# Patient Record
Sex: Female | Born: 1945
Health system: Southern US, Community
[De-identification: ages and names within clinical notes are randomized; demographics above are authoritative.]

---

## 2014-07-17 DIAGNOSIS — N183 Chronic kidney disease, stage 3 (moderate): Secondary | ICD-10-CM | POA: Diagnosis not present

## 2014-07-17 DIAGNOSIS — K219 Gastro-esophageal reflux disease without esophagitis: Secondary | ICD-10-CM | POA: Diagnosis not present

## 2014-07-17 DIAGNOSIS — Z6831 Body mass index (BMI) 31.0-31.9, adult: Secondary | ICD-10-CM | POA: Diagnosis not present

## 2014-07-17 DIAGNOSIS — E1165 Type 2 diabetes mellitus with hyperglycemia: Secondary | ICD-10-CM | POA: Diagnosis not present

## 2014-07-17 DIAGNOSIS — D649 Anemia, unspecified: Secondary | ICD-10-CM | POA: Diagnosis not present

## 2014-07-17 DIAGNOSIS — E780 Pure hypercholesterolemia: Secondary | ICD-10-CM | POA: Diagnosis not present

## 2014-07-17 DIAGNOSIS — I10 Essential (primary) hypertension: Secondary | ICD-10-CM | POA: Diagnosis not present

## 2014-07-17 DIAGNOSIS — M545 Low back pain: Secondary | ICD-10-CM | POA: Diagnosis not present

## 2014-08-10 DIAGNOSIS — E119 Type 2 diabetes mellitus without complications: Secondary | ICD-10-CM | POA: Diagnosis not present

## 2014-08-10 DIAGNOSIS — H26499 Other secondary cataract, unspecified eye: Secondary | ICD-10-CM | POA: Diagnosis not present

## 2014-08-10 DIAGNOSIS — I10 Essential (primary) hypertension: Secondary | ICD-10-CM | POA: Diagnosis not present

## 2014-08-10 DIAGNOSIS — H5210 Myopia, unspecified eye: Secondary | ICD-10-CM | POA: Diagnosis not present

## 2014-08-10 DIAGNOSIS — H524 Presbyopia: Secondary | ICD-10-CM | POA: Diagnosis not present

## 2014-08-11 DIAGNOSIS — M545 Low back pain: Secondary | ICD-10-CM | POA: Diagnosis not present

## 2014-08-11 DIAGNOSIS — K219 Gastro-esophageal reflux disease without esophagitis: Secondary | ICD-10-CM | POA: Diagnosis not present

## 2014-08-11 DIAGNOSIS — N183 Chronic kidney disease, stage 3 (moderate): Secondary | ICD-10-CM | POA: Diagnosis not present

## 2014-08-11 DIAGNOSIS — I10 Essential (primary) hypertension: Secondary | ICD-10-CM | POA: Diagnosis not present

## 2014-08-11 DIAGNOSIS — J208 Acute bronchitis due to other specified organisms: Secondary | ICD-10-CM | POA: Diagnosis not present

## 2014-08-11 DIAGNOSIS — E1165 Type 2 diabetes mellitus with hyperglycemia: Secondary | ICD-10-CM | POA: Diagnosis not present

## 2014-08-11 DIAGNOSIS — E669 Obesity, unspecified: Secondary | ICD-10-CM | POA: Diagnosis not present

## 2014-08-11 DIAGNOSIS — E780 Pure hypercholesterolemia: Secondary | ICD-10-CM | POA: Diagnosis not present

## 2014-09-28 ENCOUNTER — Ambulatory Visit: Payer: Self-pay

## 2014-10-02 DIAGNOSIS — E780 Pure hypercholesterolemia: Secondary | ICD-10-CM | POA: Diagnosis not present

## 2014-10-02 DIAGNOSIS — M545 Low back pain: Secondary | ICD-10-CM | POA: Diagnosis not present

## 2014-10-02 DIAGNOSIS — K219 Gastro-esophageal reflux disease without esophagitis: Secondary | ICD-10-CM | POA: Diagnosis not present

## 2014-10-02 DIAGNOSIS — E1165 Type 2 diabetes mellitus with hyperglycemia: Secondary | ICD-10-CM | POA: Diagnosis not present

## 2014-10-02 DIAGNOSIS — N183 Chronic kidney disease, stage 3 (moderate): Secondary | ICD-10-CM | POA: Diagnosis not present

## 2014-10-02 DIAGNOSIS — Z6832 Body mass index (BMI) 32.0-32.9, adult: Secondary | ICD-10-CM | POA: Diagnosis not present

## 2014-10-02 DIAGNOSIS — I10 Essential (primary) hypertension: Secondary | ICD-10-CM | POA: Diagnosis not present

## 2014-12-11 DIAGNOSIS — H269 Unspecified cataract: Secondary | ICD-10-CM | POA: Diagnosis not present

## 2014-12-11 DIAGNOSIS — I10 Essential (primary) hypertension: Secondary | ICD-10-CM | POA: Diagnosis not present

## 2014-12-11 DIAGNOSIS — Z1389 Encounter for screening for other disorder: Secondary | ICD-10-CM | POA: Diagnosis not present

## 2014-12-11 DIAGNOSIS — E1165 Type 2 diabetes mellitus with hyperglycemia: Secondary | ICD-10-CM | POA: Diagnosis not present

## 2014-12-11 DIAGNOSIS — E780 Pure hypercholesterolemia: Secondary | ICD-10-CM | POA: Diagnosis not present

## 2014-12-11 DIAGNOSIS — K219 Gastro-esophageal reflux disease without esophagitis: Secondary | ICD-10-CM | POA: Diagnosis not present

## 2014-12-11 DIAGNOSIS — Z9181 History of falling: Secondary | ICD-10-CM | POA: Diagnosis not present

## 2014-12-11 DIAGNOSIS — D649 Anemia, unspecified: Secondary | ICD-10-CM | POA: Diagnosis not present

## 2014-12-25 DIAGNOSIS — Z139 Encounter for screening, unspecified: Secondary | ICD-10-CM | POA: Diagnosis not present

## 2014-12-25 DIAGNOSIS — I10 Essential (primary) hypertension: Secondary | ICD-10-CM | POA: Diagnosis not present

## 2014-12-25 DIAGNOSIS — K219 Gastro-esophageal reflux disease without esophagitis: Secondary | ICD-10-CM | POA: Diagnosis not present

## 2014-12-25 DIAGNOSIS — Z6832 Body mass index (BMI) 32.0-32.9, adult: Secondary | ICD-10-CM | POA: Diagnosis not present

## 2014-12-25 DIAGNOSIS — E1165 Type 2 diabetes mellitus with hyperglycemia: Secondary | ICD-10-CM | POA: Diagnosis not present

## 2014-12-25 DIAGNOSIS — N183 Chronic kidney disease, stage 3 (moderate): Secondary | ICD-10-CM | POA: Diagnosis not present

## 2014-12-25 DIAGNOSIS — E780 Pure hypercholesterolemia: Secondary | ICD-10-CM | POA: Diagnosis not present

## 2014-12-25 DIAGNOSIS — M545 Low back pain: Secondary | ICD-10-CM | POA: Diagnosis not present

## 2015-01-08 DIAGNOSIS — E1165 Type 2 diabetes mellitus with hyperglycemia: Secondary | ICD-10-CM | POA: Diagnosis not present

## 2015-01-08 DIAGNOSIS — M545 Low back pain: Secondary | ICD-10-CM | POA: Diagnosis not present

## 2015-01-08 DIAGNOSIS — E780 Pure hypercholesterolemia: Secondary | ICD-10-CM | POA: Diagnosis not present

## 2015-01-08 DIAGNOSIS — Z6831 Body mass index (BMI) 31.0-31.9, adult: Secondary | ICD-10-CM | POA: Diagnosis not present

## 2015-01-08 DIAGNOSIS — K219 Gastro-esophageal reflux disease without esophagitis: Secondary | ICD-10-CM | POA: Diagnosis not present

## 2015-01-08 DIAGNOSIS — N183 Chronic kidney disease, stage 3 (moderate): Secondary | ICD-10-CM | POA: Diagnosis not present

## 2015-01-08 DIAGNOSIS — I10 Essential (primary) hypertension: Secondary | ICD-10-CM | POA: Diagnosis not present

## 2015-01-18 DIAGNOSIS — K219 Gastro-esophageal reflux disease without esophagitis: Secondary | ICD-10-CM | POA: Diagnosis not present

## 2015-01-18 DIAGNOSIS — E1165 Type 2 diabetes mellitus with hyperglycemia: Secondary | ICD-10-CM | POA: Diagnosis not present

## 2015-01-18 DIAGNOSIS — M545 Low back pain: Secondary | ICD-10-CM | POA: Diagnosis not present

## 2015-01-18 DIAGNOSIS — Z6831 Body mass index (BMI) 31.0-31.9, adult: Secondary | ICD-10-CM | POA: Diagnosis not present

## 2015-01-18 DIAGNOSIS — N183 Chronic kidney disease, stage 3 (moderate): Secondary | ICD-10-CM | POA: Diagnosis not present

## 2015-01-18 DIAGNOSIS — I10 Essential (primary) hypertension: Secondary | ICD-10-CM | POA: Diagnosis not present

## 2015-01-18 DIAGNOSIS — E780 Pure hypercholesterolemia: Secondary | ICD-10-CM | POA: Diagnosis not present

## 2015-03-15 DIAGNOSIS — E780 Pure hypercholesterolemia: Secondary | ICD-10-CM | POA: Diagnosis not present

## 2015-03-15 DIAGNOSIS — H269 Unspecified cataract: Secondary | ICD-10-CM | POA: Diagnosis not present

## 2015-03-15 DIAGNOSIS — Z1389 Encounter for screening for other disorder: Secondary | ICD-10-CM | POA: Diagnosis not present

## 2015-03-15 DIAGNOSIS — E1165 Type 2 diabetes mellitus with hyperglycemia: Secondary | ICD-10-CM | POA: Diagnosis not present

## 2015-03-15 DIAGNOSIS — D649 Anemia, unspecified: Secondary | ICD-10-CM | POA: Diagnosis not present

## 2015-03-15 DIAGNOSIS — K219 Gastro-esophageal reflux disease without esophagitis: Secondary | ICD-10-CM | POA: Diagnosis not present

## 2015-03-15 DIAGNOSIS — I10 Essential (primary) hypertension: Secondary | ICD-10-CM | POA: Diagnosis not present

## 2015-03-15 DIAGNOSIS — N390 Urinary tract infection, site not specified: Secondary | ICD-10-CM | POA: Diagnosis not present

## 2015-05-06 DIAGNOSIS — E119 Type 2 diabetes mellitus without complications: Secondary | ICD-10-CM | POA: Diagnosis not present

## 2015-05-06 DIAGNOSIS — H2511 Age-related nuclear cataract, right eye: Secondary | ICD-10-CM | POA: Diagnosis not present

## 2015-07-10 DIAGNOSIS — Z6832 Body mass index (BMI) 32.0-32.9, adult: Secondary | ICD-10-CM | POA: Diagnosis not present

## 2015-07-10 DIAGNOSIS — E7800 Pure hypercholesterolemia, unspecified: Secondary | ICD-10-CM | POA: Diagnosis not present

## 2015-07-10 DIAGNOSIS — N183 Chronic kidney disease, stage 3 (moderate): Secondary | ICD-10-CM | POA: Diagnosis not present

## 2015-07-10 DIAGNOSIS — M545 Low back pain: Secondary | ICD-10-CM | POA: Diagnosis not present

## 2015-07-10 DIAGNOSIS — K219 Gastro-esophageal reflux disease without esophagitis: Secondary | ICD-10-CM | POA: Diagnosis not present

## 2015-07-10 DIAGNOSIS — I10 Essential (primary) hypertension: Secondary | ICD-10-CM | POA: Diagnosis not present

## 2015-07-10 DIAGNOSIS — E1165 Type 2 diabetes mellitus with hyperglycemia: Secondary | ICD-10-CM | POA: Diagnosis not present

## 2015-07-22 DIAGNOSIS — M2011 Hallux valgus (acquired), right foot: Secondary | ICD-10-CM | POA: Diagnosis not present

## 2015-07-22 DIAGNOSIS — M2012 Hallux valgus (acquired), left foot: Secondary | ICD-10-CM | POA: Diagnosis not present

## 2015-07-22 DIAGNOSIS — B351 Tinea unguium: Secondary | ICD-10-CM | POA: Diagnosis not present

## 2015-07-22 DIAGNOSIS — E119 Type 2 diabetes mellitus without complications: Secondary | ICD-10-CM | POA: Diagnosis not present

## 2015-11-05 DIAGNOSIS — K219 Gastro-esophageal reflux disease without esophagitis: Secondary | ICD-10-CM | POA: Diagnosis not present

## 2015-11-05 DIAGNOSIS — I10 Essential (primary) hypertension: Secondary | ICD-10-CM | POA: Diagnosis not present

## 2015-11-05 DIAGNOSIS — Z6832 Body mass index (BMI) 32.0-32.9, adult: Secondary | ICD-10-CM | POA: Diagnosis not present

## 2015-11-05 DIAGNOSIS — E669 Obesity, unspecified: Secondary | ICD-10-CM | POA: Diagnosis not present

## 2015-11-05 DIAGNOSIS — E7800 Pure hypercholesterolemia, unspecified: Secondary | ICD-10-CM | POA: Diagnosis not present

## 2015-11-05 DIAGNOSIS — D649 Anemia, unspecified: Secondary | ICD-10-CM | POA: Diagnosis not present

## 2015-11-05 DIAGNOSIS — N183 Chronic kidney disease, stage 3 (moderate): Secondary | ICD-10-CM | POA: Diagnosis not present

## 2015-11-05 DIAGNOSIS — M545 Low back pain: Secondary | ICD-10-CM | POA: Diagnosis not present

## 2015-11-05 DIAGNOSIS — E1165 Type 2 diabetes mellitus with hyperglycemia: Secondary | ICD-10-CM | POA: Diagnosis not present

## 2016-02-11 DIAGNOSIS — N183 Chronic kidney disease, stage 3 (moderate): Secondary | ICD-10-CM | POA: Diagnosis not present

## 2016-02-11 DIAGNOSIS — E669 Obesity, unspecified: Secondary | ICD-10-CM | POA: Diagnosis not present

## 2016-02-11 DIAGNOSIS — I10 Essential (primary) hypertension: Secondary | ICD-10-CM | POA: Diagnosis not present

## 2016-02-11 DIAGNOSIS — Z6832 Body mass index (BMI) 32.0-32.9, adult: Secondary | ICD-10-CM | POA: Diagnosis not present

## 2016-02-11 DIAGNOSIS — M545 Low back pain: Secondary | ICD-10-CM | POA: Diagnosis not present

## 2016-02-11 DIAGNOSIS — K219 Gastro-esophageal reflux disease without esophagitis: Secondary | ICD-10-CM | POA: Diagnosis not present

## 2016-02-11 DIAGNOSIS — E7800 Pure hypercholesterolemia, unspecified: Secondary | ICD-10-CM | POA: Diagnosis not present

## 2016-02-11 DIAGNOSIS — Z9181 History of falling: Secondary | ICD-10-CM | POA: Diagnosis not present

## 2016-02-11 DIAGNOSIS — E1165 Type 2 diabetes mellitus with hyperglycemia: Secondary | ICD-10-CM | POA: Diagnosis not present

## 2016-05-15 DIAGNOSIS — Z6833 Body mass index (BMI) 33.0-33.9, adult: Secondary | ICD-10-CM | POA: Diagnosis not present

## 2016-05-15 DIAGNOSIS — M545 Low back pain: Secondary | ICD-10-CM | POA: Diagnosis not present

## 2016-05-15 DIAGNOSIS — Z1389 Encounter for screening for other disorder: Secondary | ICD-10-CM | POA: Diagnosis not present

## 2016-05-15 DIAGNOSIS — K219 Gastro-esophageal reflux disease without esophagitis: Secondary | ICD-10-CM | POA: Diagnosis not present

## 2016-05-15 DIAGNOSIS — E669 Obesity, unspecified: Secondary | ICD-10-CM | POA: Diagnosis not present

## 2016-05-15 DIAGNOSIS — E7800 Pure hypercholesterolemia, unspecified: Secondary | ICD-10-CM | POA: Diagnosis not present

## 2016-05-15 DIAGNOSIS — N183 Chronic kidney disease, stage 3 (moderate): Secondary | ICD-10-CM | POA: Diagnosis not present

## 2016-05-15 DIAGNOSIS — I10 Essential (primary) hypertension: Secondary | ICD-10-CM | POA: Diagnosis not present

## 2016-05-15 DIAGNOSIS — E119 Type 2 diabetes mellitus without complications: Secondary | ICD-10-CM | POA: Diagnosis not present

## 2016-05-21 DIAGNOSIS — K5730 Diverticulosis of large intestine without perforation or abscess without bleeding: Secondary | ICD-10-CM | POA: Diagnosis not present

## 2016-05-21 DIAGNOSIS — E279 Disorder of adrenal gland, unspecified: Secondary | ICD-10-CM | POA: Diagnosis not present

## 2016-05-21 DIAGNOSIS — N390 Urinary tract infection, site not specified: Secondary | ICD-10-CM | POA: Diagnosis not present

## 2016-05-21 DIAGNOSIS — K449 Diaphragmatic hernia without obstruction or gangrene: Secondary | ICD-10-CM | POA: Diagnosis not present

## 2016-06-02 DIAGNOSIS — N183 Chronic kidney disease, stage 3 (moderate): Secondary | ICD-10-CM | POA: Diagnosis not present

## 2016-06-02 DIAGNOSIS — E119 Type 2 diabetes mellitus without complications: Secondary | ICD-10-CM | POA: Diagnosis not present

## 2016-06-02 DIAGNOSIS — K219 Gastro-esophageal reflux disease without esophagitis: Secondary | ICD-10-CM | POA: Diagnosis not present

## 2016-06-02 DIAGNOSIS — M545 Low back pain: Secondary | ICD-10-CM | POA: Diagnosis not present

## 2016-06-02 DIAGNOSIS — E7800 Pure hypercholesterolemia, unspecified: Secondary | ICD-10-CM | POA: Diagnosis not present

## 2016-06-02 DIAGNOSIS — E669 Obesity, unspecified: Secondary | ICD-10-CM | POA: Diagnosis not present

## 2016-06-02 DIAGNOSIS — E279 Disorder of adrenal gland, unspecified: Secondary | ICD-10-CM | POA: Diagnosis not present

## 2016-06-02 DIAGNOSIS — I10 Essential (primary) hypertension: Secondary | ICD-10-CM | POA: Diagnosis not present

## 2016-06-02 DIAGNOSIS — N390 Urinary tract infection, site not specified: Secondary | ICD-10-CM | POA: Diagnosis not present

## 2016-06-09 DIAGNOSIS — M545 Low back pain: Secondary | ICD-10-CM | POA: Diagnosis not present

## 2016-06-09 DIAGNOSIS — N183 Chronic kidney disease, stage 3 (moderate): Secondary | ICD-10-CM | POA: Diagnosis not present

## 2016-06-09 DIAGNOSIS — K219 Gastro-esophageal reflux disease without esophagitis: Secondary | ICD-10-CM | POA: Diagnosis not present

## 2016-06-09 DIAGNOSIS — E119 Type 2 diabetes mellitus without complications: Secondary | ICD-10-CM | POA: Diagnosis not present

## 2016-06-09 DIAGNOSIS — I10 Essential (primary) hypertension: Secondary | ICD-10-CM | POA: Diagnosis not present

## 2016-06-09 DIAGNOSIS — E279 Disorder of adrenal gland, unspecified: Secondary | ICD-10-CM | POA: Diagnosis not present

## 2016-06-09 DIAGNOSIS — Z6832 Body mass index (BMI) 32.0-32.9, adult: Secondary | ICD-10-CM | POA: Diagnosis not present

## 2016-06-09 DIAGNOSIS — E7800 Pure hypercholesterolemia, unspecified: Secondary | ICD-10-CM | POA: Diagnosis not present

## 2016-06-13 DIAGNOSIS — E279 Disorder of adrenal gland, unspecified: Secondary | ICD-10-CM | POA: Diagnosis not present

## 2016-07-17 DIAGNOSIS — H26492 Other secondary cataract, left eye: Secondary | ICD-10-CM | POA: Diagnosis not present

## 2016-07-17 DIAGNOSIS — Z961 Presence of intraocular lens: Secondary | ICD-10-CM | POA: Diagnosis not present

## 2016-07-17 DIAGNOSIS — E119 Type 2 diabetes mellitus without complications: Secondary | ICD-10-CM | POA: Diagnosis not present

## 2016-07-17 DIAGNOSIS — H5203 Hypermetropia, bilateral: Secondary | ICD-10-CM | POA: Diagnosis not present

## 2016-07-17 DIAGNOSIS — H524 Presbyopia: Secondary | ICD-10-CM | POA: Diagnosis not present

## 2016-07-17 DIAGNOSIS — H25811 Combined forms of age-related cataract, right eye: Secondary | ICD-10-CM | POA: Diagnosis not present

## 2016-07-17 DIAGNOSIS — H52223 Regular astigmatism, bilateral: Secondary | ICD-10-CM | POA: Diagnosis not present

## 2016-07-26 DIAGNOSIS — K219 Gastro-esophageal reflux disease without esophagitis: Secondary | ICD-10-CM | POA: Diagnosis not present

## 2016-07-26 DIAGNOSIS — N183 Chronic kidney disease, stage 3 (moderate): Secondary | ICD-10-CM | POA: Diagnosis not present

## 2016-07-26 DIAGNOSIS — M545 Low back pain: Secondary | ICD-10-CM | POA: Diagnosis not present

## 2016-07-26 DIAGNOSIS — E7800 Pure hypercholesterolemia, unspecified: Secondary | ICD-10-CM | POA: Diagnosis not present

## 2016-07-26 DIAGNOSIS — I10 Essential (primary) hypertension: Secondary | ICD-10-CM | POA: Diagnosis not present

## 2016-07-26 DIAGNOSIS — E669 Obesity, unspecified: Secondary | ICD-10-CM | POA: Diagnosis not present

## 2016-07-26 DIAGNOSIS — E119 Type 2 diabetes mellitus without complications: Secondary | ICD-10-CM | POA: Diagnosis not present

## 2016-07-26 DIAGNOSIS — N390 Urinary tract infection, site not specified: Secondary | ICD-10-CM | POA: Diagnosis not present

## 2016-07-26 DIAGNOSIS — E279 Disorder of adrenal gland, unspecified: Secondary | ICD-10-CM | POA: Diagnosis not present

## 2016-09-11 DIAGNOSIS — K219 Gastro-esophageal reflux disease without esophagitis: Secondary | ICD-10-CM | POA: Diagnosis not present

## 2016-09-11 DIAGNOSIS — Z6832 Body mass index (BMI) 32.0-32.9, adult: Secondary | ICD-10-CM | POA: Diagnosis not present

## 2016-09-11 DIAGNOSIS — E669 Obesity, unspecified: Secondary | ICD-10-CM | POA: Diagnosis not present

## 2016-09-11 DIAGNOSIS — I10 Essential (primary) hypertension: Secondary | ICD-10-CM | POA: Diagnosis not present

## 2016-09-11 DIAGNOSIS — N183 Chronic kidney disease, stage 3 (moderate): Secondary | ICD-10-CM | POA: Diagnosis not present

## 2016-09-11 DIAGNOSIS — E279 Disorder of adrenal gland, unspecified: Secondary | ICD-10-CM | POA: Diagnosis not present

## 2016-09-11 DIAGNOSIS — E7800 Pure hypercholesterolemia, unspecified: Secondary | ICD-10-CM | POA: Diagnosis not present

## 2016-09-11 DIAGNOSIS — E119 Type 2 diabetes mellitus without complications: Secondary | ICD-10-CM | POA: Diagnosis not present

## 2016-09-11 DIAGNOSIS — M545 Low back pain: Secondary | ICD-10-CM | POA: Diagnosis not present

## 2016-11-05 DIAGNOSIS — N390 Urinary tract infection, site not specified: Secondary | ICD-10-CM | POA: Diagnosis not present

## 2016-11-15 DIAGNOSIS — E279 Disorder of adrenal gland, unspecified: Secondary | ICD-10-CM | POA: Diagnosis not present

## 2016-11-15 DIAGNOSIS — N390 Urinary tract infection, site not specified: Secondary | ICD-10-CM | POA: Diagnosis not present

## 2016-11-15 DIAGNOSIS — M545 Low back pain: Secondary | ICD-10-CM | POA: Diagnosis not present

## 2016-11-15 DIAGNOSIS — N183 Chronic kidney disease, stage 3 (moderate): Secondary | ICD-10-CM | POA: Diagnosis not present

## 2016-11-15 DIAGNOSIS — E7800 Pure hypercholesterolemia, unspecified: Secondary | ICD-10-CM | POA: Diagnosis not present

## 2016-11-15 DIAGNOSIS — K219 Gastro-esophageal reflux disease without esophagitis: Secondary | ICD-10-CM | POA: Diagnosis not present

## 2016-11-15 DIAGNOSIS — E669 Obesity, unspecified: Secondary | ICD-10-CM | POA: Diagnosis not present

## 2016-11-15 DIAGNOSIS — E119 Type 2 diabetes mellitus without complications: Secondary | ICD-10-CM | POA: Diagnosis not present

## 2016-11-15 DIAGNOSIS — I10 Essential (primary) hypertension: Secondary | ICD-10-CM | POA: Diagnosis not present

## 2016-11-22 DIAGNOSIS — E119 Type 2 diabetes mellitus without complications: Secondary | ICD-10-CM | POA: Diagnosis not present

## 2016-11-22 DIAGNOSIS — E299 Testicular dysfunction, unspecified: Secondary | ICD-10-CM | POA: Diagnosis not present

## 2016-11-22 DIAGNOSIS — K219 Gastro-esophageal reflux disease without esophagitis: Secondary | ICD-10-CM | POA: Diagnosis not present

## 2016-11-22 DIAGNOSIS — I10 Essential (primary) hypertension: Secondary | ICD-10-CM | POA: Diagnosis not present

## 2016-11-22 DIAGNOSIS — M545 Low back pain: Secondary | ICD-10-CM | POA: Diagnosis not present

## 2016-11-22 DIAGNOSIS — E7800 Pure hypercholesterolemia, unspecified: Secondary | ICD-10-CM | POA: Diagnosis not present

## 2016-11-22 DIAGNOSIS — Z139 Encounter for screening, unspecified: Secondary | ICD-10-CM | POA: Diagnosis not present

## 2016-11-22 DIAGNOSIS — N183 Chronic kidney disease, stage 3 (moderate): Secondary | ICD-10-CM | POA: Diagnosis not present

## 2016-11-22 DIAGNOSIS — N390 Urinary tract infection, site not specified: Secondary | ICD-10-CM | POA: Diagnosis not present

## 2016-11-29 DIAGNOSIS — Z6832 Body mass index (BMI) 32.0-32.9, adult: Secondary | ICD-10-CM | POA: Diagnosis not present

## 2016-11-29 DIAGNOSIS — K219 Gastro-esophageal reflux disease without esophagitis: Secondary | ICD-10-CM | POA: Diagnosis not present

## 2016-11-29 DIAGNOSIS — M545 Low back pain: Secondary | ICD-10-CM | POA: Diagnosis not present

## 2016-11-29 DIAGNOSIS — N183 Chronic kidney disease, stage 3 (moderate): Secondary | ICD-10-CM | POA: Diagnosis not present

## 2016-11-29 DIAGNOSIS — E279 Disorder of adrenal gland, unspecified: Secondary | ICD-10-CM | POA: Diagnosis not present

## 2016-11-29 DIAGNOSIS — I10 Essential (primary) hypertension: Secondary | ICD-10-CM | POA: Diagnosis not present

## 2016-11-29 DIAGNOSIS — E119 Type 2 diabetes mellitus without complications: Secondary | ICD-10-CM | POA: Diagnosis not present

## 2016-11-29 DIAGNOSIS — N390 Urinary tract infection, site not specified: Secondary | ICD-10-CM | POA: Diagnosis not present

## 2016-11-29 DIAGNOSIS — E7800 Pure hypercholesterolemia, unspecified: Secondary | ICD-10-CM | POA: Diagnosis not present

## 2016-12-18 DIAGNOSIS — M545 Low back pain: Secondary | ICD-10-CM | POA: Diagnosis not present

## 2016-12-18 DIAGNOSIS — E7800 Pure hypercholesterolemia, unspecified: Secondary | ICD-10-CM | POA: Diagnosis not present

## 2016-12-18 DIAGNOSIS — E279 Disorder of adrenal gland, unspecified: Secondary | ICD-10-CM | POA: Diagnosis not present

## 2016-12-18 DIAGNOSIS — E119 Type 2 diabetes mellitus without complications: Secondary | ICD-10-CM | POA: Diagnosis not present

## 2016-12-18 DIAGNOSIS — D649 Anemia, unspecified: Secondary | ICD-10-CM | POA: Diagnosis not present

## 2016-12-18 DIAGNOSIS — Z1231 Encounter for screening mammogram for malignant neoplasm of breast: Secondary | ICD-10-CM | POA: Diagnosis not present

## 2016-12-18 DIAGNOSIS — I10 Essential (primary) hypertension: Secondary | ICD-10-CM | POA: Diagnosis not present

## 2016-12-18 DIAGNOSIS — N183 Chronic kidney disease, stage 3 (moderate): Secondary | ICD-10-CM | POA: Diagnosis not present

## 2016-12-18 DIAGNOSIS — K219 Gastro-esophageal reflux disease without esophagitis: Secondary | ICD-10-CM | POA: Diagnosis not present

## 2016-12-25 DIAGNOSIS — N183 Chronic kidney disease, stage 3 (moderate): Secondary | ICD-10-CM | POA: Diagnosis not present

## 2016-12-25 DIAGNOSIS — Z1231 Encounter for screening mammogram for malignant neoplasm of breast: Secondary | ICD-10-CM | POA: Diagnosis not present

## 2016-12-25 DIAGNOSIS — K219 Gastro-esophageal reflux disease without esophagitis: Secondary | ICD-10-CM | POA: Diagnosis not present

## 2016-12-25 DIAGNOSIS — E119 Type 2 diabetes mellitus without complications: Secondary | ICD-10-CM | POA: Diagnosis not present

## 2016-12-25 DIAGNOSIS — I10 Essential (primary) hypertension: Secondary | ICD-10-CM | POA: Diagnosis not present

## 2016-12-25 DIAGNOSIS — M545 Low back pain: Secondary | ICD-10-CM | POA: Diagnosis not present

## 2016-12-25 DIAGNOSIS — E279 Disorder of adrenal gland, unspecified: Secondary | ICD-10-CM | POA: Diagnosis not present

## 2016-12-25 DIAGNOSIS — E7800 Pure hypercholesterolemia, unspecified: Secondary | ICD-10-CM | POA: Diagnosis not present

## 2017-01-10 DIAGNOSIS — E279 Disorder of adrenal gland, unspecified: Secondary | ICD-10-CM | POA: Diagnosis not present

## 2017-01-10 DIAGNOSIS — Z6833 Body mass index (BMI) 33.0-33.9, adult: Secondary | ICD-10-CM | POA: Diagnosis not present

## 2017-01-10 DIAGNOSIS — J309 Allergic rhinitis, unspecified: Secondary | ICD-10-CM | POA: Diagnosis not present

## 2017-01-10 DIAGNOSIS — K219 Gastro-esophageal reflux disease without esophagitis: Secondary | ICD-10-CM | POA: Diagnosis not present

## 2017-01-10 DIAGNOSIS — E7800 Pure hypercholesterolemia, unspecified: Secondary | ICD-10-CM | POA: Diagnosis not present

## 2017-01-10 DIAGNOSIS — N183 Chronic kidney disease, stage 3 (moderate): Secondary | ICD-10-CM | POA: Diagnosis not present

## 2017-01-10 DIAGNOSIS — M545 Low back pain: Secondary | ICD-10-CM | POA: Diagnosis not present

## 2017-01-10 DIAGNOSIS — I10 Essential (primary) hypertension: Secondary | ICD-10-CM | POA: Diagnosis not present

## 2017-01-10 DIAGNOSIS — E119 Type 2 diabetes mellitus without complications: Secondary | ICD-10-CM | POA: Diagnosis not present

## 2017-01-17 DIAGNOSIS — M545 Low back pain: Secondary | ICD-10-CM | POA: Diagnosis not present

## 2017-01-17 DIAGNOSIS — I10 Essential (primary) hypertension: Secondary | ICD-10-CM | POA: Diagnosis not present

## 2017-01-17 DIAGNOSIS — E279 Disorder of adrenal gland, unspecified: Secondary | ICD-10-CM | POA: Diagnosis not present

## 2017-01-17 DIAGNOSIS — E119 Type 2 diabetes mellitus without complications: Secondary | ICD-10-CM | POA: Diagnosis not present

## 2017-01-17 DIAGNOSIS — J309 Allergic rhinitis, unspecified: Secondary | ICD-10-CM | POA: Diagnosis not present

## 2017-01-17 DIAGNOSIS — K219 Gastro-esophageal reflux disease without esophagitis: Secondary | ICD-10-CM | POA: Diagnosis not present

## 2017-01-17 DIAGNOSIS — Z6833 Body mass index (BMI) 33.0-33.9, adult: Secondary | ICD-10-CM | POA: Diagnosis not present

## 2017-01-17 DIAGNOSIS — N183 Chronic kidney disease, stage 3 (moderate): Secondary | ICD-10-CM | POA: Diagnosis not present

## 2017-01-17 DIAGNOSIS — E7800 Pure hypercholesterolemia, unspecified: Secondary | ICD-10-CM | POA: Diagnosis not present

## 2017-01-31 DIAGNOSIS — E7800 Pure hypercholesterolemia, unspecified: Secondary | ICD-10-CM | POA: Diagnosis not present

## 2017-01-31 DIAGNOSIS — E279 Disorder of adrenal gland, unspecified: Secondary | ICD-10-CM | POA: Diagnosis not present

## 2017-01-31 DIAGNOSIS — Z6832 Body mass index (BMI) 32.0-32.9, adult: Secondary | ICD-10-CM | POA: Diagnosis not present

## 2017-01-31 DIAGNOSIS — N813 Complete uterovaginal prolapse: Secondary | ICD-10-CM | POA: Diagnosis not present

## 2017-01-31 DIAGNOSIS — I10 Essential (primary) hypertension: Secondary | ICD-10-CM | POA: Diagnosis not present

## 2017-01-31 DIAGNOSIS — K219 Gastro-esophageal reflux disease without esophagitis: Secondary | ICD-10-CM | POA: Diagnosis not present

## 2017-01-31 DIAGNOSIS — J309 Allergic rhinitis, unspecified: Secondary | ICD-10-CM | POA: Diagnosis not present

## 2017-01-31 DIAGNOSIS — M545 Low back pain: Secondary | ICD-10-CM | POA: Diagnosis not present

## 2017-01-31 DIAGNOSIS — E119 Type 2 diabetes mellitus without complications: Secondary | ICD-10-CM | POA: Diagnosis not present

## 2017-02-14 DIAGNOSIS — K219 Gastro-esophageal reflux disease without esophagitis: Secondary | ICD-10-CM | POA: Diagnosis not present

## 2017-02-14 DIAGNOSIS — E279 Disorder of adrenal gland, unspecified: Secondary | ICD-10-CM | POA: Diagnosis not present

## 2017-02-14 DIAGNOSIS — E7800 Pure hypercholesterolemia, unspecified: Secondary | ICD-10-CM | POA: Diagnosis not present

## 2017-02-14 DIAGNOSIS — N183 Chronic kidney disease, stage 3 (moderate): Secondary | ICD-10-CM | POA: Diagnosis not present

## 2017-02-14 DIAGNOSIS — Z6832 Body mass index (BMI) 32.0-32.9, adult: Secondary | ICD-10-CM | POA: Diagnosis not present

## 2017-02-14 DIAGNOSIS — J309 Allergic rhinitis, unspecified: Secondary | ICD-10-CM | POA: Diagnosis not present

## 2017-02-14 DIAGNOSIS — I10 Essential (primary) hypertension: Secondary | ICD-10-CM | POA: Diagnosis not present

## 2017-02-14 DIAGNOSIS — M545 Low back pain: Secondary | ICD-10-CM | POA: Diagnosis not present

## 2017-02-14 DIAGNOSIS — E119 Type 2 diabetes mellitus without complications: Secondary | ICD-10-CM | POA: Diagnosis not present

## 2017-03-16 DIAGNOSIS — J309 Allergic rhinitis, unspecified: Secondary | ICD-10-CM | POA: Diagnosis not present

## 2017-03-16 DIAGNOSIS — M545 Low back pain: Secondary | ICD-10-CM | POA: Diagnosis not present

## 2017-03-16 DIAGNOSIS — Z6833 Body mass index (BMI) 33.0-33.9, adult: Secondary | ICD-10-CM | POA: Diagnosis not present

## 2017-03-16 DIAGNOSIS — E7800 Pure hypercholesterolemia, unspecified: Secondary | ICD-10-CM | POA: Diagnosis not present

## 2017-03-16 DIAGNOSIS — K219 Gastro-esophageal reflux disease without esophagitis: Secondary | ICD-10-CM | POA: Diagnosis not present

## 2017-03-16 DIAGNOSIS — N183 Chronic kidney disease, stage 3 (moderate): Secondary | ICD-10-CM | POA: Diagnosis not present

## 2017-03-16 DIAGNOSIS — E279 Disorder of adrenal gland, unspecified: Secondary | ICD-10-CM | POA: Diagnosis not present

## 2017-03-16 DIAGNOSIS — E119 Type 2 diabetes mellitus without complications: Secondary | ICD-10-CM | POA: Diagnosis not present

## 2017-03-16 DIAGNOSIS — I10 Essential (primary) hypertension: Secondary | ICD-10-CM | POA: Diagnosis not present

## 2017-03-27 DIAGNOSIS — J309 Allergic rhinitis, unspecified: Secondary | ICD-10-CM | POA: Diagnosis not present

## 2017-03-27 DIAGNOSIS — N183 Chronic kidney disease, stage 3 (moderate): Secondary | ICD-10-CM | POA: Diagnosis not present

## 2017-03-27 DIAGNOSIS — Z6833 Body mass index (BMI) 33.0-33.9, adult: Secondary | ICD-10-CM | POA: Diagnosis not present

## 2017-03-27 DIAGNOSIS — E119 Type 2 diabetes mellitus without complications: Secondary | ICD-10-CM | POA: Diagnosis not present

## 2017-03-27 DIAGNOSIS — E279 Disorder of adrenal gland, unspecified: Secondary | ICD-10-CM | POA: Diagnosis not present

## 2017-03-27 DIAGNOSIS — I10 Essential (primary) hypertension: Secondary | ICD-10-CM | POA: Diagnosis not present

## 2017-03-27 DIAGNOSIS — E7800 Pure hypercholesterolemia, unspecified: Secondary | ICD-10-CM | POA: Diagnosis not present

## 2017-03-27 DIAGNOSIS — M545 Low back pain: Secondary | ICD-10-CM | POA: Diagnosis not present

## 2017-03-27 DIAGNOSIS — K219 Gastro-esophageal reflux disease without esophagitis: Secondary | ICD-10-CM | POA: Diagnosis not present

## 2017-04-14 DIAGNOSIS — K219 Gastro-esophageal reflux disease without esophagitis: Secondary | ICD-10-CM | POA: Diagnosis not present

## 2017-04-14 DIAGNOSIS — Z9181 History of falling: Secondary | ICD-10-CM | POA: Diagnosis not present

## 2017-04-14 DIAGNOSIS — E7800 Pure hypercholesterolemia, unspecified: Secondary | ICD-10-CM | POA: Diagnosis not present

## 2017-04-14 DIAGNOSIS — E279 Disorder of adrenal gland, unspecified: Secondary | ICD-10-CM | POA: Diagnosis not present

## 2017-04-14 DIAGNOSIS — N183 Chronic kidney disease, stage 3 (moderate): Secondary | ICD-10-CM | POA: Diagnosis not present

## 2017-04-14 DIAGNOSIS — D649 Anemia, unspecified: Secondary | ICD-10-CM | POA: Diagnosis not present

## 2017-04-14 DIAGNOSIS — I10 Essential (primary) hypertension: Secondary | ICD-10-CM | POA: Diagnosis not present

## 2017-04-14 DIAGNOSIS — E119 Type 2 diabetes mellitus without complications: Secondary | ICD-10-CM | POA: Diagnosis not present

## 2017-04-14 DIAGNOSIS — M25562 Pain in left knee: Secondary | ICD-10-CM | POA: Diagnosis not present

## 2017-04-28 DIAGNOSIS — N183 Chronic kidney disease, stage 3 (moderate): Secondary | ICD-10-CM | POA: Diagnosis not present

## 2017-04-28 DIAGNOSIS — M25562 Pain in left knee: Secondary | ICD-10-CM | POA: Diagnosis not present

## 2017-04-28 DIAGNOSIS — K219 Gastro-esophageal reflux disease without esophagitis: Secondary | ICD-10-CM | POA: Diagnosis not present

## 2017-04-28 DIAGNOSIS — E119 Type 2 diabetes mellitus without complications: Secondary | ICD-10-CM | POA: Diagnosis not present

## 2017-04-28 DIAGNOSIS — J309 Allergic rhinitis, unspecified: Secondary | ICD-10-CM | POA: Diagnosis not present

## 2017-04-28 DIAGNOSIS — Z6832 Body mass index (BMI) 32.0-32.9, adult: Secondary | ICD-10-CM | POA: Diagnosis not present

## 2017-04-28 DIAGNOSIS — M545 Low back pain: Secondary | ICD-10-CM | POA: Diagnosis not present

## 2017-04-28 DIAGNOSIS — E279 Disorder of adrenal gland, unspecified: Secondary | ICD-10-CM | POA: Diagnosis not present

## 2017-04-28 DIAGNOSIS — E7800 Pure hypercholesterolemia, unspecified: Secondary | ICD-10-CM | POA: Diagnosis not present

## 2017-06-07 DIAGNOSIS — E7800 Pure hypercholesterolemia, unspecified: Secondary | ICD-10-CM | POA: Diagnosis not present

## 2017-06-07 DIAGNOSIS — N183 Chronic kidney disease, stage 3 (moderate): Secondary | ICD-10-CM | POA: Diagnosis not present

## 2017-06-07 DIAGNOSIS — M545 Low back pain: Secondary | ICD-10-CM | POA: Diagnosis not present

## 2017-06-07 DIAGNOSIS — J309 Allergic rhinitis, unspecified: Secondary | ICD-10-CM | POA: Diagnosis not present

## 2017-06-07 DIAGNOSIS — N390 Urinary tract infection, site not specified: Secondary | ICD-10-CM | POA: Diagnosis not present

## 2017-06-07 DIAGNOSIS — E119 Type 2 diabetes mellitus without complications: Secondary | ICD-10-CM | POA: Diagnosis not present

## 2017-06-07 DIAGNOSIS — K219 Gastro-esophageal reflux disease without esophagitis: Secondary | ICD-10-CM | POA: Diagnosis not present

## 2017-06-07 DIAGNOSIS — I10 Essential (primary) hypertension: Secondary | ICD-10-CM | POA: Diagnosis not present

## 2017-06-07 DIAGNOSIS — E279 Disorder of adrenal gland, unspecified: Secondary | ICD-10-CM | POA: Diagnosis not present

## 2017-07-07 DIAGNOSIS — N183 Chronic kidney disease, stage 3 (moderate): Secondary | ICD-10-CM | POA: Diagnosis not present

## 2017-07-07 DIAGNOSIS — M545 Low back pain: Secondary | ICD-10-CM | POA: Diagnosis not present

## 2017-07-07 DIAGNOSIS — E1122 Type 2 diabetes mellitus with diabetic chronic kidney disease: Secondary | ICD-10-CM | POA: Diagnosis not present

## 2017-07-07 DIAGNOSIS — I10 Essential (primary) hypertension: Secondary | ICD-10-CM | POA: Diagnosis not present

## 2017-07-07 DIAGNOSIS — Z1331 Encounter for screening for depression: Secondary | ICD-10-CM | POA: Diagnosis not present

## 2017-07-07 DIAGNOSIS — E119 Type 2 diabetes mellitus without complications: Secondary | ICD-10-CM | POA: Diagnosis not present

## 2017-07-07 DIAGNOSIS — K219 Gastro-esophageal reflux disease without esophagitis: Secondary | ICD-10-CM | POA: Diagnosis not present

## 2017-07-07 DIAGNOSIS — E7800 Pure hypercholesterolemia, unspecified: Secondary | ICD-10-CM | POA: Diagnosis not present

## 2017-07-07 DIAGNOSIS — E279 Disorder of adrenal gland, unspecified: Secondary | ICD-10-CM | POA: Diagnosis not present

## 2017-07-07 DIAGNOSIS — J309 Allergic rhinitis, unspecified: Secondary | ICD-10-CM | POA: Diagnosis not present

## 2017-09-13 DIAGNOSIS — N183 Chronic kidney disease, stage 3 (moderate): Secondary | ICD-10-CM | POA: Diagnosis not present

## 2017-09-13 DIAGNOSIS — I10 Essential (primary) hypertension: Secondary | ICD-10-CM | POA: Diagnosis not present

## 2017-09-13 DIAGNOSIS — J309 Allergic rhinitis, unspecified: Secondary | ICD-10-CM | POA: Diagnosis not present

## 2017-09-13 DIAGNOSIS — M545 Low back pain: Secondary | ICD-10-CM | POA: Diagnosis not present

## 2017-09-13 DIAGNOSIS — N390 Urinary tract infection, site not specified: Secondary | ICD-10-CM | POA: Diagnosis not present

## 2017-09-13 DIAGNOSIS — E279 Disorder of adrenal gland, unspecified: Secondary | ICD-10-CM | POA: Diagnosis not present

## 2017-09-13 DIAGNOSIS — K219 Gastro-esophageal reflux disease without esophagitis: Secondary | ICD-10-CM | POA: Diagnosis not present

## 2017-09-13 DIAGNOSIS — E7800 Pure hypercholesterolemia, unspecified: Secondary | ICD-10-CM | POA: Diagnosis not present

## 2017-09-13 DIAGNOSIS — E669 Obesity, unspecified: Secondary | ICD-10-CM | POA: Diagnosis not present

## 2017-09-29 DIAGNOSIS — M545 Low back pain: Secondary | ICD-10-CM | POA: Diagnosis not present

## 2017-09-29 DIAGNOSIS — N183 Chronic kidney disease, stage 3 (moderate): Secondary | ICD-10-CM | POA: Diagnosis not present

## 2017-09-29 DIAGNOSIS — J309 Allergic rhinitis, unspecified: Secondary | ICD-10-CM | POA: Diagnosis not present

## 2017-09-29 DIAGNOSIS — N390 Urinary tract infection, site not specified: Secondary | ICD-10-CM | POA: Diagnosis not present

## 2017-09-29 DIAGNOSIS — E7800 Pure hypercholesterolemia, unspecified: Secondary | ICD-10-CM | POA: Diagnosis not present

## 2017-09-29 DIAGNOSIS — K219 Gastro-esophageal reflux disease without esophagitis: Secondary | ICD-10-CM | POA: Diagnosis not present

## 2017-09-29 DIAGNOSIS — I10 Essential (primary) hypertension: Secondary | ICD-10-CM | POA: Diagnosis not present

## 2017-09-29 DIAGNOSIS — D649 Anemia, unspecified: Secondary | ICD-10-CM | POA: Diagnosis not present

## 2017-09-29 DIAGNOSIS — E279 Disorder of adrenal gland, unspecified: Secondary | ICD-10-CM | POA: Diagnosis not present

## 2017-10-20 DIAGNOSIS — J309 Allergic rhinitis, unspecified: Secondary | ICD-10-CM | POA: Diagnosis not present

## 2017-10-20 DIAGNOSIS — K219 Gastro-esophageal reflux disease without esophagitis: Secondary | ICD-10-CM | POA: Diagnosis not present

## 2017-10-20 DIAGNOSIS — M545 Low back pain: Secondary | ICD-10-CM | POA: Diagnosis not present

## 2017-10-20 DIAGNOSIS — N183 Chronic kidney disease, stage 3 (moderate): Secondary | ICD-10-CM | POA: Diagnosis not present

## 2017-10-20 DIAGNOSIS — E279 Disorder of adrenal gland, unspecified: Secondary | ICD-10-CM | POA: Diagnosis not present

## 2017-10-20 DIAGNOSIS — E119 Type 2 diabetes mellitus without complications: Secondary | ICD-10-CM | POA: Diagnosis not present

## 2017-10-20 DIAGNOSIS — M25552 Pain in left hip: Secondary | ICD-10-CM | POA: Diagnosis not present

## 2017-10-20 DIAGNOSIS — I10 Essential (primary) hypertension: Secondary | ICD-10-CM | POA: Diagnosis not present

## 2017-10-20 DIAGNOSIS — E7800 Pure hypercholesterolemia, unspecified: Secondary | ICD-10-CM | POA: Diagnosis not present

## 2017-11-03 DIAGNOSIS — D649 Anemia, unspecified: Secondary | ICD-10-CM | POA: Diagnosis not present

## 2017-11-03 DIAGNOSIS — E119 Type 2 diabetes mellitus without complications: Secondary | ICD-10-CM | POA: Diagnosis not present

## 2017-11-03 DIAGNOSIS — M545 Low back pain: Secondary | ICD-10-CM | POA: Diagnosis not present

## 2017-11-03 DIAGNOSIS — N183 Chronic kidney disease, stage 3 (moderate): Secondary | ICD-10-CM | POA: Diagnosis not present

## 2017-11-03 DIAGNOSIS — M25552 Pain in left hip: Secondary | ICD-10-CM | POA: Diagnosis not present

## 2017-11-03 DIAGNOSIS — E279 Disorder of adrenal gland, unspecified: Secondary | ICD-10-CM | POA: Diagnosis not present

## 2017-11-03 DIAGNOSIS — E7800 Pure hypercholesterolemia, unspecified: Secondary | ICD-10-CM | POA: Diagnosis not present

## 2017-11-03 DIAGNOSIS — I10 Essential (primary) hypertension: Secondary | ICD-10-CM | POA: Diagnosis not present

## 2017-11-03 DIAGNOSIS — K219 Gastro-esophageal reflux disease without esophagitis: Secondary | ICD-10-CM | POA: Diagnosis not present

## 2017-12-15 DIAGNOSIS — J309 Allergic rhinitis, unspecified: Secondary | ICD-10-CM | POA: Diagnosis not present

## 2017-12-15 DIAGNOSIS — E7800 Pure hypercholesterolemia, unspecified: Secondary | ICD-10-CM | POA: Diagnosis not present

## 2017-12-15 DIAGNOSIS — M545 Low back pain: Secondary | ICD-10-CM | POA: Diagnosis not present

## 2017-12-15 DIAGNOSIS — I10 Essential (primary) hypertension: Secondary | ICD-10-CM | POA: Diagnosis not present

## 2017-12-15 DIAGNOSIS — E279 Disorder of adrenal gland, unspecified: Secondary | ICD-10-CM | POA: Diagnosis not present

## 2017-12-15 DIAGNOSIS — K219 Gastro-esophageal reflux disease without esophagitis: Secondary | ICD-10-CM | POA: Diagnosis not present

## 2017-12-15 DIAGNOSIS — E119 Type 2 diabetes mellitus without complications: Secondary | ICD-10-CM | POA: Diagnosis not present

## 2017-12-15 DIAGNOSIS — N183 Chronic kidney disease, stage 3 (moderate): Secondary | ICD-10-CM | POA: Diagnosis not present

## 2017-12-15 DIAGNOSIS — Z1339 Encounter for screening examination for other mental health and behavioral disorders: Secondary | ICD-10-CM | POA: Diagnosis not present

## 2018-01-19 DIAGNOSIS — I10 Essential (primary) hypertension: Secondary | ICD-10-CM | POA: Diagnosis not present

## 2018-01-19 DIAGNOSIS — J309 Allergic rhinitis, unspecified: Secondary | ICD-10-CM | POA: Diagnosis not present

## 2018-01-19 DIAGNOSIS — N183 Chronic kidney disease, stage 3 (moderate): Secondary | ICD-10-CM | POA: Diagnosis not present

## 2018-01-19 DIAGNOSIS — D649 Anemia, unspecified: Secondary | ICD-10-CM | POA: Diagnosis not present

## 2018-01-19 DIAGNOSIS — E7800 Pure hypercholesterolemia, unspecified: Secondary | ICD-10-CM | POA: Diagnosis not present

## 2018-01-19 DIAGNOSIS — M545 Low back pain: Secondary | ICD-10-CM | POA: Diagnosis not present

## 2018-01-19 DIAGNOSIS — E119 Type 2 diabetes mellitus without complications: Secondary | ICD-10-CM | POA: Diagnosis not present

## 2018-01-19 DIAGNOSIS — E279 Disorder of adrenal gland, unspecified: Secondary | ICD-10-CM | POA: Diagnosis not present

## 2018-01-19 DIAGNOSIS — K219 Gastro-esophageal reflux disease without esophagitis: Secondary | ICD-10-CM | POA: Diagnosis not present

## 2018-02-25 DIAGNOSIS — H26492 Other secondary cataract, left eye: Secondary | ICD-10-CM | POA: Diagnosis not present

## 2018-02-25 DIAGNOSIS — I10 Essential (primary) hypertension: Secondary | ICD-10-CM | POA: Diagnosis not present

## 2018-02-25 DIAGNOSIS — H25811 Combined forms of age-related cataract, right eye: Secondary | ICD-10-CM | POA: Diagnosis not present

## 2018-02-25 DIAGNOSIS — E119 Type 2 diabetes mellitus without complications: Secondary | ICD-10-CM | POA: Diagnosis not present

## 2018-02-25 DIAGNOSIS — Z7984 Long term (current) use of oral hypoglycemic drugs: Secondary | ICD-10-CM | POA: Diagnosis not present

## 2018-02-25 DIAGNOSIS — Z961 Presence of intraocular lens: Secondary | ICD-10-CM | POA: Diagnosis not present

## 2018-03-02 DIAGNOSIS — J309 Allergic rhinitis, unspecified: Secondary | ICD-10-CM | POA: Diagnosis not present

## 2018-03-02 DIAGNOSIS — E7800 Pure hypercholesterolemia, unspecified: Secondary | ICD-10-CM | POA: Diagnosis not present

## 2018-03-02 DIAGNOSIS — I10 Essential (primary) hypertension: Secondary | ICD-10-CM | POA: Diagnosis not present

## 2018-03-02 DIAGNOSIS — M545 Low back pain: Secondary | ICD-10-CM | POA: Diagnosis not present

## 2018-03-02 DIAGNOSIS — N183 Chronic kidney disease, stage 3 (moderate): Secondary | ICD-10-CM | POA: Diagnosis not present

## 2018-03-02 DIAGNOSIS — E278 Other specified disorders of adrenal gland: Secondary | ICD-10-CM | POA: Diagnosis not present

## 2018-03-02 DIAGNOSIS — N390 Urinary tract infection, site not specified: Secondary | ICD-10-CM | POA: Diagnosis not present

## 2018-03-02 DIAGNOSIS — K219 Gastro-esophageal reflux disease without esophagitis: Secondary | ICD-10-CM | POA: Diagnosis not present

## 2018-03-02 DIAGNOSIS — E119 Type 2 diabetes mellitus without complications: Secondary | ICD-10-CM | POA: Diagnosis not present

## 2018-04-23 DIAGNOSIS — Z9842 Cataract extraction status, left eye: Secondary | ICD-10-CM | POA: Diagnosis not present

## 2018-04-23 DIAGNOSIS — H25811 Combined forms of age-related cataract, right eye: Secondary | ICD-10-CM | POA: Diagnosis not present

## 2018-04-23 DIAGNOSIS — Z961 Presence of intraocular lens: Secondary | ICD-10-CM | POA: Diagnosis not present

## 2018-04-23 DIAGNOSIS — H26492 Other secondary cataract, left eye: Secondary | ICD-10-CM | POA: Diagnosis not present

## 2018-05-28 DIAGNOSIS — Z79899 Other long term (current) drug therapy: Secondary | ICD-10-CM | POA: Diagnosis not present

## 2018-05-28 DIAGNOSIS — Z9841 Cataract extraction status, right eye: Secondary | ICD-10-CM | POA: Diagnosis not present

## 2018-05-28 DIAGNOSIS — H35033 Hypertensive retinopathy, bilateral: Secondary | ICD-10-CM | POA: Diagnosis not present

## 2018-05-28 DIAGNOSIS — Z7984 Long term (current) use of oral hypoglycemic drugs: Secondary | ICD-10-CM | POA: Diagnosis not present

## 2018-05-28 DIAGNOSIS — M1990 Unspecified osteoarthritis, unspecified site: Secondary | ICD-10-CM | POA: Diagnosis not present

## 2018-05-28 DIAGNOSIS — E7800 Pure hypercholesterolemia, unspecified: Secondary | ICD-10-CM | POA: Diagnosis not present

## 2018-05-28 DIAGNOSIS — E119 Type 2 diabetes mellitus without complications: Secondary | ICD-10-CM | POA: Diagnosis not present

## 2018-05-28 DIAGNOSIS — Z961 Presence of intraocular lens: Secondary | ICD-10-CM | POA: Diagnosis not present

## 2018-05-28 DIAGNOSIS — H25811 Combined forms of age-related cataract, right eye: Secondary | ICD-10-CM | POA: Diagnosis not present

## 2018-05-28 DIAGNOSIS — H259 Unspecified age-related cataract: Secondary | ICD-10-CM | POA: Diagnosis not present

## 2018-05-28 DIAGNOSIS — K219 Gastro-esophageal reflux disease without esophagitis: Secondary | ICD-10-CM | POA: Diagnosis not present

## 2018-05-28 DIAGNOSIS — I10 Essential (primary) hypertension: Secondary | ICD-10-CM | POA: Diagnosis not present

## 2018-05-28 DIAGNOSIS — E1136 Type 2 diabetes mellitus with diabetic cataract: Secondary | ICD-10-CM | POA: Diagnosis not present

## 2018-06-05 DIAGNOSIS — E119 Type 2 diabetes mellitus without complications: Secondary | ICD-10-CM | POA: Diagnosis not present

## 2018-06-05 DIAGNOSIS — N183 Chronic kidney disease, stage 3 (moderate): Secondary | ICD-10-CM | POA: Diagnosis not present

## 2018-06-05 DIAGNOSIS — E7800 Pure hypercholesterolemia, unspecified: Secondary | ICD-10-CM | POA: Diagnosis not present

## 2018-06-05 DIAGNOSIS — E278 Other specified disorders of adrenal gland: Secondary | ICD-10-CM | POA: Diagnosis not present

## 2018-06-05 DIAGNOSIS — M545 Low back pain: Secondary | ICD-10-CM | POA: Diagnosis not present

## 2018-06-05 DIAGNOSIS — J309 Allergic rhinitis, unspecified: Secondary | ICD-10-CM | POA: Diagnosis not present

## 2018-06-05 DIAGNOSIS — K219 Gastro-esophageal reflux disease without esophagitis: Secondary | ICD-10-CM | POA: Diagnosis not present

## 2018-06-05 DIAGNOSIS — D649 Anemia, unspecified: Secondary | ICD-10-CM | POA: Diagnosis not present

## 2018-06-05 DIAGNOSIS — I10 Essential (primary) hypertension: Secondary | ICD-10-CM | POA: Diagnosis not present

## 2018-07-26 DIAGNOSIS — J029 Acute pharyngitis, unspecified: Secondary | ICD-10-CM | POA: Diagnosis not present

## 2018-07-26 DIAGNOSIS — T783XXA Angioneurotic edema, initial encounter: Secondary | ICD-10-CM | POA: Diagnosis not present

## 2018-07-26 DIAGNOSIS — T445X5A Adverse effect of predominantly beta-adrenoreceptor agonists, initial encounter: Secondary | ICD-10-CM | POA: Diagnosis not present

## 2018-08-16 DIAGNOSIS — N183 Chronic kidney disease, stage 3 (moderate): Secondary | ICD-10-CM | POA: Diagnosis not present

## 2018-08-16 DIAGNOSIS — E278 Other specified disorders of adrenal gland: Secondary | ICD-10-CM | POA: Diagnosis not present

## 2018-08-16 DIAGNOSIS — E669 Obesity, unspecified: Secondary | ICD-10-CM | POA: Diagnosis not present

## 2018-08-16 DIAGNOSIS — M545 Low back pain: Secondary | ICD-10-CM | POA: Diagnosis not present

## 2018-08-16 DIAGNOSIS — E7800 Pure hypercholesterolemia, unspecified: Secondary | ICD-10-CM | POA: Diagnosis not present

## 2018-08-16 DIAGNOSIS — K219 Gastro-esophageal reflux disease without esophagitis: Secondary | ICD-10-CM | POA: Diagnosis not present

## 2018-08-16 DIAGNOSIS — I10 Essential (primary) hypertension: Secondary | ICD-10-CM | POA: Diagnosis not present

## 2018-08-16 DIAGNOSIS — J309 Allergic rhinitis, unspecified: Secondary | ICD-10-CM | POA: Diagnosis not present

## 2018-08-16 DIAGNOSIS — E119 Type 2 diabetes mellitus without complications: Secondary | ICD-10-CM | POA: Diagnosis not present

## 2018-08-22 DIAGNOSIS — N183 Chronic kidney disease, stage 3 (moderate): Secondary | ICD-10-CM | POA: Diagnosis not present

## 2018-08-22 DIAGNOSIS — J069 Acute upper respiratory infection, unspecified: Secondary | ICD-10-CM | POA: Diagnosis not present

## 2018-08-22 DIAGNOSIS — K219 Gastro-esophageal reflux disease without esophagitis: Secondary | ICD-10-CM | POA: Diagnosis not present

## 2018-08-22 DIAGNOSIS — E119 Type 2 diabetes mellitus without complications: Secondary | ICD-10-CM | POA: Diagnosis not present

## 2018-08-22 DIAGNOSIS — I10 Essential (primary) hypertension: Secondary | ICD-10-CM | POA: Diagnosis not present

## 2018-08-22 DIAGNOSIS — M545 Low back pain: Secondary | ICD-10-CM | POA: Diagnosis not present

## 2018-08-22 DIAGNOSIS — E669 Obesity, unspecified: Secondary | ICD-10-CM | POA: Diagnosis not present

## 2018-08-22 DIAGNOSIS — E278 Other specified disorders of adrenal gland: Secondary | ICD-10-CM | POA: Diagnosis not present

## 2018-08-22 DIAGNOSIS — J309 Allergic rhinitis, unspecified: Secondary | ICD-10-CM | POA: Diagnosis not present

## 2018-09-07 DIAGNOSIS — Z9181 History of falling: Secondary | ICD-10-CM | POA: Diagnosis not present

## 2018-09-07 DIAGNOSIS — I10 Essential (primary) hypertension: Secondary | ICD-10-CM | POA: Diagnosis not present

## 2018-09-07 DIAGNOSIS — N183 Chronic kidney disease, stage 3 (moderate): Secondary | ICD-10-CM | POA: Diagnosis not present

## 2018-09-07 DIAGNOSIS — E7800 Pure hypercholesterolemia, unspecified: Secondary | ICD-10-CM | POA: Diagnosis not present

## 2018-09-07 DIAGNOSIS — D649 Anemia, unspecified: Secondary | ICD-10-CM | POA: Diagnosis not present

## 2018-09-07 DIAGNOSIS — E278 Other specified disorders of adrenal gland: Secondary | ICD-10-CM | POA: Diagnosis not present

## 2018-09-07 DIAGNOSIS — K219 Gastro-esophageal reflux disease without esophagitis: Secondary | ICD-10-CM | POA: Diagnosis not present

## 2018-09-07 DIAGNOSIS — E119 Type 2 diabetes mellitus without complications: Secondary | ICD-10-CM | POA: Diagnosis not present

## 2018-09-07 DIAGNOSIS — M545 Low back pain: Secondary | ICD-10-CM | POA: Diagnosis not present

## 2018-09-07 DIAGNOSIS — Z1331 Encounter for screening for depression: Secondary | ICD-10-CM | POA: Diagnosis not present

## 2018-09-21 DIAGNOSIS — J301 Allergic rhinitis due to pollen: Secondary | ICD-10-CM | POA: Diagnosis not present

## 2018-09-21 DIAGNOSIS — M5380 Other specified dorsopathies, site unspecified: Secondary | ICD-10-CM | POA: Diagnosis not present

## 2018-09-26 DIAGNOSIS — E119 Type 2 diabetes mellitus without complications: Secondary | ICD-10-CM | POA: Diagnosis not present

## 2018-09-26 DIAGNOSIS — J309 Allergic rhinitis, unspecified: Secondary | ICD-10-CM | POA: Diagnosis not present

## 2018-09-26 DIAGNOSIS — M545 Low back pain: Secondary | ICD-10-CM | POA: Diagnosis not present

## 2018-09-26 DIAGNOSIS — E7800 Pure hypercholesterolemia, unspecified: Secondary | ICD-10-CM | POA: Diagnosis not present

## 2018-09-26 DIAGNOSIS — K219 Gastro-esophageal reflux disease without esophagitis: Secondary | ICD-10-CM | POA: Diagnosis not present

## 2018-09-26 DIAGNOSIS — E278 Other specified disorders of adrenal gland: Secondary | ICD-10-CM | POA: Diagnosis not present

## 2018-09-26 DIAGNOSIS — I10 Essential (primary) hypertension: Secondary | ICD-10-CM | POA: Diagnosis not present

## 2018-09-26 DIAGNOSIS — N390 Urinary tract infection, site not specified: Secondary | ICD-10-CM | POA: Diagnosis not present

## 2018-09-26 DIAGNOSIS — N183 Chronic kidney disease, stage 3 (moderate): Secondary | ICD-10-CM | POA: Diagnosis not present

## 2018-10-01 DIAGNOSIS — J209 Acute bronchitis, unspecified: Secondary | ICD-10-CM | POA: Diagnosis not present

## 2018-10-09 DIAGNOSIS — L853 Xerosis cutis: Secondary | ICD-10-CM | POA: Diagnosis not present

## 2018-10-09 DIAGNOSIS — R1084 Generalized abdominal pain: Secondary | ICD-10-CM | POA: Diagnosis not present

## 2018-10-09 DIAGNOSIS — R1032 Left lower quadrant pain: Secondary | ICD-10-CM | POA: Diagnosis not present

## 2018-10-09 DIAGNOSIS — J0100 Acute maxillary sinusitis, unspecified: Secondary | ICD-10-CM | POA: Diagnosis not present

## 2018-10-12 DIAGNOSIS — E119 Type 2 diabetes mellitus without complications: Secondary | ICD-10-CM | POA: Diagnosis not present

## 2018-10-12 DIAGNOSIS — E7800 Pure hypercholesterolemia, unspecified: Secondary | ICD-10-CM | POA: Diagnosis not present

## 2018-10-12 DIAGNOSIS — K219 Gastro-esophageal reflux disease without esophagitis: Secondary | ICD-10-CM | POA: Diagnosis not present

## 2018-10-12 DIAGNOSIS — E669 Obesity, unspecified: Secondary | ICD-10-CM | POA: Diagnosis not present

## 2018-10-12 DIAGNOSIS — M25552 Pain in left hip: Secondary | ICD-10-CM | POA: Diagnosis not present

## 2018-10-12 DIAGNOSIS — M545 Low back pain: Secondary | ICD-10-CM | POA: Diagnosis not present

## 2018-10-12 DIAGNOSIS — J309 Allergic rhinitis, unspecified: Secondary | ICD-10-CM | POA: Diagnosis not present

## 2018-10-12 DIAGNOSIS — E278 Other specified disorders of adrenal gland: Secondary | ICD-10-CM | POA: Diagnosis not present

## 2018-10-12 DIAGNOSIS — N183 Chronic kidney disease, stage 3 (moderate): Secondary | ICD-10-CM | POA: Diagnosis not present

## 2018-10-16 DIAGNOSIS — E278 Other specified disorders of adrenal gland: Secondary | ICD-10-CM | POA: Diagnosis not present

## 2018-10-16 DIAGNOSIS — E7800 Pure hypercholesterolemia, unspecified: Secondary | ICD-10-CM | POA: Diagnosis not present

## 2018-10-16 DIAGNOSIS — Z139 Encounter for screening, unspecified: Secondary | ICD-10-CM | POA: Diagnosis not present

## 2018-10-16 DIAGNOSIS — M25552 Pain in left hip: Secondary | ICD-10-CM | POA: Diagnosis not present

## 2018-10-16 DIAGNOSIS — K219 Gastro-esophageal reflux disease without esophagitis: Secondary | ICD-10-CM | POA: Diagnosis not present

## 2018-10-16 DIAGNOSIS — M545 Low back pain: Secondary | ICD-10-CM | POA: Diagnosis not present

## 2018-10-16 DIAGNOSIS — N183 Chronic kidney disease, stage 3 (moderate): Secondary | ICD-10-CM | POA: Diagnosis not present

## 2018-10-16 DIAGNOSIS — I10 Essential (primary) hypertension: Secondary | ICD-10-CM | POA: Diagnosis not present

## 2018-10-16 DIAGNOSIS — J309 Allergic rhinitis, unspecified: Secondary | ICD-10-CM | POA: Diagnosis not present

## 2018-10-24 DIAGNOSIS — N183 Chronic kidney disease, stage 3 (moderate): Secondary | ICD-10-CM | POA: Diagnosis not present

## 2018-10-24 DIAGNOSIS — J309 Allergic rhinitis, unspecified: Secondary | ICD-10-CM | POA: Diagnosis not present

## 2018-10-24 DIAGNOSIS — E7800 Pure hypercholesterolemia, unspecified: Secondary | ICD-10-CM | POA: Diagnosis not present

## 2018-10-24 DIAGNOSIS — M25511 Pain in right shoulder: Secondary | ICD-10-CM | POA: Diagnosis not present

## 2018-10-24 DIAGNOSIS — I10 Essential (primary) hypertension: Secondary | ICD-10-CM | POA: Diagnosis not present

## 2018-10-24 DIAGNOSIS — E119 Type 2 diabetes mellitus without complications: Secondary | ICD-10-CM | POA: Diagnosis not present

## 2018-10-24 DIAGNOSIS — E278 Other specified disorders of adrenal gland: Secondary | ICD-10-CM | POA: Diagnosis not present

## 2018-10-24 DIAGNOSIS — K219 Gastro-esophageal reflux disease without esophagitis: Secondary | ICD-10-CM | POA: Diagnosis not present

## 2018-10-24 DIAGNOSIS — M545 Low back pain: Secondary | ICD-10-CM | POA: Diagnosis not present

## 2018-10-30 DIAGNOSIS — E278 Other specified disorders of adrenal gland: Secondary | ICD-10-CM | POA: Diagnosis not present

## 2018-10-30 DIAGNOSIS — M25511 Pain in right shoulder: Secondary | ICD-10-CM | POA: Diagnosis not present

## 2018-10-30 DIAGNOSIS — E7800 Pure hypercholesterolemia, unspecified: Secondary | ICD-10-CM | POA: Diagnosis not present

## 2018-10-30 DIAGNOSIS — I10 Essential (primary) hypertension: Secondary | ICD-10-CM | POA: Diagnosis not present

## 2018-10-30 DIAGNOSIS — K219 Gastro-esophageal reflux disease without esophagitis: Secondary | ICD-10-CM | POA: Diagnosis not present

## 2018-10-30 DIAGNOSIS — J309 Allergic rhinitis, unspecified: Secondary | ICD-10-CM | POA: Diagnosis not present

## 2018-10-30 DIAGNOSIS — M545 Low back pain: Secondary | ICD-10-CM | POA: Diagnosis not present

## 2018-10-30 DIAGNOSIS — E119 Type 2 diabetes mellitus without complications: Secondary | ICD-10-CM | POA: Diagnosis not present

## 2018-10-30 DIAGNOSIS — N183 Chronic kidney disease, stage 3 (moderate): Secondary | ICD-10-CM | POA: Diagnosis not present

## 2018-11-13 DIAGNOSIS — E1122 Type 2 diabetes mellitus with diabetic chronic kidney disease: Secondary | ICD-10-CM | POA: Diagnosis not present

## 2018-11-13 DIAGNOSIS — K219 Gastro-esophageal reflux disease without esophagitis: Secondary | ICD-10-CM | POA: Diagnosis not present

## 2018-11-13 DIAGNOSIS — N183 Chronic kidney disease, stage 3 (moderate): Secondary | ICD-10-CM | POA: Diagnosis not present

## 2018-11-13 DIAGNOSIS — E7800 Pure hypercholesterolemia, unspecified: Secondary | ICD-10-CM | POA: Diagnosis not present

## 2018-11-13 DIAGNOSIS — J309 Allergic rhinitis, unspecified: Secondary | ICD-10-CM | POA: Diagnosis not present

## 2018-11-13 DIAGNOSIS — E669 Obesity, unspecified: Secondary | ICD-10-CM | POA: Diagnosis not present

## 2018-11-13 DIAGNOSIS — E119 Type 2 diabetes mellitus without complications: Secondary | ICD-10-CM | POA: Diagnosis not present

## 2018-11-13 DIAGNOSIS — I10 Essential (primary) hypertension: Secondary | ICD-10-CM | POA: Diagnosis not present

## 2018-11-13 DIAGNOSIS — E278 Other specified disorders of adrenal gland: Secondary | ICD-10-CM | POA: Diagnosis not present

## 2018-11-13 DIAGNOSIS — J0100 Acute maxillary sinusitis, unspecified: Secondary | ICD-10-CM | POA: Diagnosis not present

## 2018-12-04 DIAGNOSIS — E669 Obesity, unspecified: Secondary | ICD-10-CM | POA: Diagnosis not present

## 2018-12-04 DIAGNOSIS — N183 Chronic kidney disease, stage 3 (moderate): Secondary | ICD-10-CM | POA: Diagnosis not present

## 2018-12-04 DIAGNOSIS — I10 Essential (primary) hypertension: Secondary | ICD-10-CM | POA: Diagnosis not present

## 2018-12-04 DIAGNOSIS — E119 Type 2 diabetes mellitus without complications: Secondary | ICD-10-CM | POA: Diagnosis not present

## 2018-12-04 DIAGNOSIS — J309 Allergic rhinitis, unspecified: Secondary | ICD-10-CM | POA: Diagnosis not present

## 2018-12-04 DIAGNOSIS — E7800 Pure hypercholesterolemia, unspecified: Secondary | ICD-10-CM | POA: Diagnosis not present

## 2018-12-04 DIAGNOSIS — J0100 Acute maxillary sinusitis, unspecified: Secondary | ICD-10-CM | POA: Diagnosis not present

## 2018-12-04 DIAGNOSIS — K219 Gastro-esophageal reflux disease without esophagitis: Secondary | ICD-10-CM | POA: Diagnosis not present

## 2018-12-04 DIAGNOSIS — E278 Other specified disorders of adrenal gland: Secondary | ICD-10-CM | POA: Diagnosis not present

## 2018-12-13 DIAGNOSIS — E119 Type 2 diabetes mellitus without complications: Secondary | ICD-10-CM | POA: Diagnosis not present

## 2018-12-13 DIAGNOSIS — J309 Allergic rhinitis, unspecified: Secondary | ICD-10-CM | POA: Diagnosis not present

## 2018-12-13 DIAGNOSIS — E278 Other specified disorders of adrenal gland: Secondary | ICD-10-CM | POA: Diagnosis not present

## 2018-12-13 DIAGNOSIS — E7800 Pure hypercholesterolemia, unspecified: Secondary | ICD-10-CM | POA: Diagnosis not present

## 2018-12-13 DIAGNOSIS — M545 Low back pain: Secondary | ICD-10-CM | POA: Diagnosis not present

## 2018-12-13 DIAGNOSIS — N183 Chronic kidney disease, stage 3 (moderate): Secondary | ICD-10-CM | POA: Diagnosis not present

## 2018-12-13 DIAGNOSIS — K219 Gastro-esophageal reflux disease without esophagitis: Secondary | ICD-10-CM | POA: Diagnosis not present

## 2018-12-13 DIAGNOSIS — J0100 Acute maxillary sinusitis, unspecified: Secondary | ICD-10-CM | POA: Diagnosis not present

## 2018-12-13 DIAGNOSIS — I10 Essential (primary) hypertension: Secondary | ICD-10-CM | POA: Diagnosis not present

## 2018-12-18 DIAGNOSIS — M545 Low back pain: Secondary | ICD-10-CM | POA: Diagnosis not present

## 2018-12-18 DIAGNOSIS — K219 Gastro-esophageal reflux disease without esophagitis: Secondary | ICD-10-CM | POA: Diagnosis not present

## 2018-12-18 DIAGNOSIS — I10 Essential (primary) hypertension: Secondary | ICD-10-CM | POA: Diagnosis not present

## 2018-12-18 DIAGNOSIS — E7800 Pure hypercholesterolemia, unspecified: Secondary | ICD-10-CM | POA: Diagnosis not present

## 2018-12-18 DIAGNOSIS — N183 Chronic kidney disease, stage 3 (moderate): Secondary | ICD-10-CM | POA: Diagnosis not present

## 2018-12-18 DIAGNOSIS — J309 Allergic rhinitis, unspecified: Secondary | ICD-10-CM | POA: Diagnosis not present

## 2018-12-18 DIAGNOSIS — E119 Type 2 diabetes mellitus without complications: Secondary | ICD-10-CM | POA: Diagnosis not present

## 2018-12-18 DIAGNOSIS — E278 Other specified disorders of adrenal gland: Secondary | ICD-10-CM | POA: Diagnosis not present

## 2018-12-18 DIAGNOSIS — E669 Obesity, unspecified: Secondary | ICD-10-CM | POA: Diagnosis not present

## 2018-12-23 DIAGNOSIS — I10 Essential (primary) hypertension: Secondary | ICD-10-CM | POA: Diagnosis not present

## 2018-12-23 DIAGNOSIS — K219 Gastro-esophageal reflux disease without esophagitis: Secondary | ICD-10-CM | POA: Diagnosis not present

## 2018-12-23 DIAGNOSIS — N183 Chronic kidney disease, stage 3 (moderate): Secondary | ICD-10-CM | POA: Diagnosis not present

## 2018-12-23 DIAGNOSIS — J309 Allergic rhinitis, unspecified: Secondary | ICD-10-CM | POA: Diagnosis not present

## 2018-12-23 DIAGNOSIS — E278 Other specified disorders of adrenal gland: Secondary | ICD-10-CM | POA: Diagnosis not present

## 2018-12-23 DIAGNOSIS — E7800 Pure hypercholesterolemia, unspecified: Secondary | ICD-10-CM | POA: Diagnosis not present

## 2018-12-23 DIAGNOSIS — E119 Type 2 diabetes mellitus without complications: Secondary | ICD-10-CM | POA: Diagnosis not present

## 2018-12-23 DIAGNOSIS — G8929 Other chronic pain: Secondary | ICD-10-CM | POA: Diagnosis not present

## 2018-12-23 DIAGNOSIS — M545 Low back pain: Secondary | ICD-10-CM | POA: Diagnosis not present

## 2018-12-25 DIAGNOSIS — J069 Acute upper respiratory infection, unspecified: Secondary | ICD-10-CM | POA: Diagnosis not present

## 2018-12-25 DIAGNOSIS — I10 Essential (primary) hypertension: Secondary | ICD-10-CM | POA: Diagnosis not present

## 2018-12-25 DIAGNOSIS — E278 Other specified disorders of adrenal gland: Secondary | ICD-10-CM | POA: Diagnosis not present

## 2018-12-25 DIAGNOSIS — N183 Chronic kidney disease, stage 3 (moderate): Secondary | ICD-10-CM | POA: Diagnosis not present

## 2018-12-25 DIAGNOSIS — G8929 Other chronic pain: Secondary | ICD-10-CM | POA: Diagnosis not present

## 2018-12-25 DIAGNOSIS — J309 Allergic rhinitis, unspecified: Secondary | ICD-10-CM | POA: Diagnosis not present

## 2018-12-25 DIAGNOSIS — M545 Low back pain: Secondary | ICD-10-CM | POA: Diagnosis not present

## 2018-12-25 DIAGNOSIS — E119 Type 2 diabetes mellitus without complications: Secondary | ICD-10-CM | POA: Diagnosis not present

## 2018-12-25 DIAGNOSIS — K219 Gastro-esophageal reflux disease without esophagitis: Secondary | ICD-10-CM | POA: Diagnosis not present

## 2019-01-01 DIAGNOSIS — K219 Gastro-esophageal reflux disease without esophagitis: Secondary | ICD-10-CM | POA: Diagnosis not present

## 2019-01-01 DIAGNOSIS — I10 Essential (primary) hypertension: Secondary | ICD-10-CM | POA: Diagnosis not present

## 2019-01-01 DIAGNOSIS — E278 Other specified disorders of adrenal gland: Secondary | ICD-10-CM | POA: Diagnosis not present

## 2019-01-01 DIAGNOSIS — J069 Acute upper respiratory infection, unspecified: Secondary | ICD-10-CM | POA: Diagnosis not present

## 2019-01-01 DIAGNOSIS — M545 Low back pain: Secondary | ICD-10-CM | POA: Diagnosis not present

## 2019-01-01 DIAGNOSIS — E669 Obesity, unspecified: Secondary | ICD-10-CM | POA: Diagnosis not present

## 2019-01-01 DIAGNOSIS — N183 Chronic kidney disease, stage 3 (moderate): Secondary | ICD-10-CM | POA: Diagnosis not present

## 2019-01-01 DIAGNOSIS — E119 Type 2 diabetes mellitus without complications: Secondary | ICD-10-CM | POA: Diagnosis not present

## 2019-01-01 DIAGNOSIS — J309 Allergic rhinitis, unspecified: Secondary | ICD-10-CM | POA: Diagnosis not present

## 2019-01-07 DIAGNOSIS — J0100 Acute maxillary sinusitis, unspecified: Secondary | ICD-10-CM | POA: Diagnosis not present

## 2019-01-11 DIAGNOSIS — E7800 Pure hypercholesterolemia, unspecified: Secondary | ICD-10-CM | POA: Diagnosis not present

## 2019-01-11 DIAGNOSIS — Z79899 Other long term (current) drug therapy: Secondary | ICD-10-CM | POA: Diagnosis not present

## 2019-01-11 DIAGNOSIS — I10 Essential (primary) hypertension: Secondary | ICD-10-CM | POA: Diagnosis not present

## 2019-01-11 DIAGNOSIS — E160 Drug-induced hypoglycemia without coma: Secondary | ICD-10-CM | POA: Diagnosis not present

## 2019-01-11 DIAGNOSIS — E11649 Type 2 diabetes mellitus with hypoglycemia without coma: Secondary | ICD-10-CM | POA: Diagnosis not present

## 2019-01-11 DIAGNOSIS — T383X1A Poisoning by insulin and oral hypoglycemic [antidiabetic] drugs, accidental (unintentional), initial encounter: Secondary | ICD-10-CM | POA: Diagnosis not present

## 2019-01-11 DIAGNOSIS — M1990 Unspecified osteoarthritis, unspecified site: Secondary | ICD-10-CM | POA: Diagnosis not present

## 2019-01-13 DIAGNOSIS — E119 Type 2 diabetes mellitus without complications: Secondary | ICD-10-CM | POA: Diagnosis not present

## 2019-01-13 DIAGNOSIS — I10 Essential (primary) hypertension: Secondary | ICD-10-CM | POA: Diagnosis not present

## 2019-01-13 DIAGNOSIS — G8929 Other chronic pain: Secondary | ICD-10-CM | POA: Diagnosis not present

## 2019-01-13 DIAGNOSIS — E7800 Pure hypercholesterolemia, unspecified: Secondary | ICD-10-CM | POA: Diagnosis not present

## 2019-01-13 DIAGNOSIS — J309 Allergic rhinitis, unspecified: Secondary | ICD-10-CM | POA: Diagnosis not present

## 2019-01-13 DIAGNOSIS — E278 Other specified disorders of adrenal gland: Secondary | ICD-10-CM | POA: Diagnosis not present

## 2019-01-13 DIAGNOSIS — N183 Chronic kidney disease, stage 3 (moderate): Secondary | ICD-10-CM | POA: Diagnosis not present

## 2019-01-13 DIAGNOSIS — K219 Gastro-esophageal reflux disease without esophagitis: Secondary | ICD-10-CM | POA: Diagnosis not present

## 2019-01-13 DIAGNOSIS — M545 Low back pain: Secondary | ICD-10-CM | POA: Diagnosis not present

## 2019-01-21 DIAGNOSIS — M545 Low back pain: Secondary | ICD-10-CM | POA: Diagnosis not present

## 2019-01-21 DIAGNOSIS — I10 Essential (primary) hypertension: Secondary | ICD-10-CM | POA: Diagnosis not present

## 2019-01-21 DIAGNOSIS — K219 Gastro-esophageal reflux disease without esophagitis: Secondary | ICD-10-CM | POA: Diagnosis not present

## 2019-01-21 DIAGNOSIS — E278 Other specified disorders of adrenal gland: Secondary | ICD-10-CM | POA: Diagnosis not present

## 2019-01-21 DIAGNOSIS — E7800 Pure hypercholesterolemia, unspecified: Secondary | ICD-10-CM | POA: Diagnosis not present

## 2019-01-21 DIAGNOSIS — N183 Chronic kidney disease, stage 3 (moderate): Secondary | ICD-10-CM | POA: Diagnosis not present

## 2019-01-21 DIAGNOSIS — E669 Obesity, unspecified: Secondary | ICD-10-CM | POA: Diagnosis not present

## 2019-01-21 DIAGNOSIS — J309 Allergic rhinitis, unspecified: Secondary | ICD-10-CM | POA: Diagnosis not present

## 2019-01-21 DIAGNOSIS — E119 Type 2 diabetes mellitus without complications: Secondary | ICD-10-CM | POA: Diagnosis not present

## 2019-01-23 DIAGNOSIS — E669 Obesity, unspecified: Secondary | ICD-10-CM | POA: Diagnosis not present

## 2019-01-23 DIAGNOSIS — N183 Chronic kidney disease, stage 3 (moderate): Secondary | ICD-10-CM | POA: Diagnosis not present

## 2019-01-23 DIAGNOSIS — E7800 Pure hypercholesterolemia, unspecified: Secondary | ICD-10-CM | POA: Diagnosis not present

## 2019-01-23 DIAGNOSIS — I10 Essential (primary) hypertension: Secondary | ICD-10-CM | POA: Diagnosis not present

## 2019-01-23 DIAGNOSIS — E119 Type 2 diabetes mellitus without complications: Secondary | ICD-10-CM | POA: Diagnosis not present

## 2019-01-23 DIAGNOSIS — R1310 Dysphagia, unspecified: Secondary | ICD-10-CM | POA: Diagnosis not present

## 2019-01-23 DIAGNOSIS — E278 Other specified disorders of adrenal gland: Secondary | ICD-10-CM | POA: Diagnosis not present

## 2019-01-23 DIAGNOSIS — M545 Low back pain: Secondary | ICD-10-CM | POA: Diagnosis not present

## 2019-01-23 DIAGNOSIS — K219 Gastro-esophageal reflux disease without esophagitis: Secondary | ICD-10-CM | POA: Diagnosis not present

## 2019-01-24 ENCOUNTER — Encounter: Payer: Self-pay | Admitting: Emergency Medicine

## 2019-01-24 ENCOUNTER — Emergency Department (HOSPITAL_COMMUNITY)
Admission: EM | Admit: 2019-01-24 | Discharge: 2019-01-24 | Disposition: A | Discharge status: Home / Self Care | Payer: Medicare HMO | Attending: Emergency Medicine | Admitting: Emergency Medicine

## 2019-01-24 ENCOUNTER — Emergency Department (HOSPITAL_COMMUNITY): Payer: Medicare HMO

## 2019-01-24 DIAGNOSIS — E876 Hypokalemia: Secondary | ICD-10-CM | POA: Diagnosis not present

## 2019-01-24 DIAGNOSIS — R1310 Dysphagia, unspecified: Secondary | ICD-10-CM | POA: Insufficient documentation

## 2019-01-24 LAB — COMPREHENSIVE METABOLIC PANEL
ALT: 14 U/L (ref 0–44)
AST: 13 U/L — ABNORMAL LOW (ref 15–41)
Albumin: 3.9 g/dL (ref 3.5–5.0)
Alkaline Phosphatase: 57 U/L (ref 38–126)
Anion gap: 14 (ref 5–15)
BUN: 21 mg/dL (ref 8–23)
CO2: 28 mmol/L (ref 22–32)
Calcium: 10.5 mg/dL — ABNORMAL HIGH (ref 8.9–10.3)
Chloride: 96 mmol/L — ABNORMAL LOW (ref 98–111)
Creatinine, Ser: 1.08 mg/dL — ABNORMAL HIGH (ref 0.44–1.00)
GFR calc Af Amer: 59 mL/min — ABNORMAL LOW (ref >60)
GFR calc non Af Amer: 51 mL/min — ABNORMAL LOW (ref >60)
Glucose, Bld: 147 mg/dL — ABNORMAL HIGH (ref 70–99)
Potassium: 3 mmol/L — ABNORMAL LOW (ref 3.5–5.1)
Sodium: 138 mmol/L (ref 135–145)
Total Bilirubin: 0.6 mg/dL (ref 0.3–1.2)
Total Protein: 7.3 g/dL (ref 6.5–8.1)

## 2019-01-24 LAB — CBC WITH DIFFERENTIAL/PLATELET
Abs Immature Granulocytes: 0.04 10*3/uL (ref 0.00–0.07)
Basophils Absolute: 0 10*3/uL (ref 0.0–0.1)
Basophils Relative: 0 %
Eosinophils Absolute: 0 10*3/uL (ref 0.0–0.5)
Eosinophils Relative: 0 %
HCT: 37.6 % (ref 36.0–46.0)
Hemoglobin: 11.4 g/dL — ABNORMAL LOW (ref 12.0–15.0)
Immature Granulocytes: 1 %
Lymphocytes Relative: 18 %
Lymphs Abs: 1.5 10*3/uL (ref 0.7–4.0)
MCH: 23.4 pg — ABNORMAL LOW (ref 26.0–34.0)
MCHC: 30.3 g/dL (ref 30.0–36.0)
MCV: 77.2 fL — ABNORMAL LOW (ref 80.0–100.0)
Monocytes Absolute: 0.5 10*3/uL (ref 0.1–1.0)
Monocytes Relative: 6 %
Neutro Abs: 6.3 10*3/uL (ref 1.7–7.7)
Neutrophils Relative %: 75 %
Platelets: 240 10*3/uL (ref 150–400)
RBC: 4.87 MIL/uL (ref 3.87–5.11)
RDW: 14.5 % (ref 11.5–15.5)
WBC: 8.3 10*3/uL (ref 4.0–10.5)
nRBC: 0 % (ref 0.0–0.2)

## 2019-01-24 LAB — LIPASE, BLOOD: Lipase: 34 U/L (ref 11–51)

## 2019-01-24 LAB — TROPONIN I (HIGH SENSITIVITY): Troponin I (High Sensitivity): 6 ng/L (ref <18)

## 2019-01-24 MED ORDER — PANTOPRAZOLE SODIUM 40 MG PO TBEC
40.0000 mg | DELAYED_RELEASE_TABLET | Freq: Once | ORAL | Status: AC
  Administered 2019-01-24: 40 mg via ORAL
  Filled 2019-01-24: qty 1

## 2019-01-24 MED ORDER — POTASSIUM CHLORIDE CRYS ER 20 MEQ PO TBCR
40.0000 meq | EXTENDED_RELEASE_TABLET | Freq: Once | ORAL | Status: AC
  Administered 2019-01-24: 40 meq via ORAL
  Filled 2019-01-24: qty 2

## 2019-01-24 MED ORDER — ALUM & MAG HYDROXIDE-SIMETH 200-200-20 MG/5ML PO SUSP
15.0000 mL | Freq: Once | ORAL | Status: AC
  Administered 2019-01-24: 15 mL via ORAL
  Filled 2019-01-24: qty 30

## 2019-01-24 MED ORDER — PANTOPRAZOLE SODIUM 40 MG IV SOLR
40.0000 mg | Freq: Once | INTRAVENOUS | Status: DC
  Filled 2019-01-24: qty 40

## 2019-01-24 NOTE — ED Triage Notes (Signed)
Pt reports having problems swallowing- lots of phlegm- hx of allergies. She is concerns bc diabetic and is having difficulty intaking. She will take swallow and burp and it comes up. Has reflux hx.

## 2019-01-24 NOTE — ED Notes (Addendum)
Patient having difficulty swallowing water and pills. Able to take medications, multiple attempts at swallowing observed.

## 2019-01-24 NOTE — ED Provider Notes (Signed)
[facility] Provider Note   CSN: [id number] Arrival date & time: 01/24/19  3154    History   Chief Complaint Chief Complaint  Patient presents with  . Gastroesophageal Reflux    HPI Karen Ramos is a 73 y.o. female.     Patient is a 73 year old female who presents with difficulty swallowing.  She states over the last few weeks she has had some trouble swallowing although it is gotten worse over the last week.  She states when she tries to swallow stuff it feels like it takes a long time for it to go down and sometimes it comes back up.  She is able to swallow liquids but again sometimes it takes a while for it to go down.  She can swallow food if she chews it into small pieces.  She has some intermittent nausea.  She does have a history of GERD but does not take anything for it regularly.  She also has a history of allergies and does state that in the mornings particularly she has a lot of phlegm in her throat.  She denies any specific chest pain although she feels tightness in her neck, particularly when she is trying to eat or drink anything.  No known history of esophageal strictures or other issues.  She does not notice any neck swelling.  Her PCP is in [place].     No past medical history on file.  There are no active problems to display for this patient.    The histories are not reviewed yet. Please review them in the "History" navigator section and refresh this [place].   OB History   No obstetric history on file.      Home Medications    Prior to Admission medications   Not on File    Family History No family history on file.  Social History Social History   Tobacco Use  . Smoking status: Not on file  Substance Use Topics  . Alcohol use: Not on file  . Drug use: Not on file     Allergies   Lisinopril   Review of Systems Review of Systems  Constitutional: Negative for chills, diaphoresis, fatigue and  fever.  HENT: Negative for congestion, rhinorrhea and sneezing.   Eyes: Negative.   Respiratory: Negative for cough, chest tightness and shortness of breath.   Cardiovascular: Negative for chest pain and leg swelling.  Gastrointestinal: Positive for nausea. Negative for abdominal pain, blood in stool, diarrhea and vomiting.       Difficulty swallowing food  Genitourinary: Negative for difficulty urinating, flank pain, frequency and hematuria.  Musculoskeletal: Negative for arthralgias and back pain.  Skin: Negative for rash.  Neurological: Negative for dizziness, speech difficulty, weakness, numbness and headaches.     Physical Exam Updated Vital Signs BP 134/79   Pulse 75   Temp 98.8 F (37.1 C) (Oral)   Resp 14   SpO2 99%   Physical Exam Constitutional:      Appearance: She is well-developed.  HENT:     Head: Normocephalic and atraumatic.  Eyes:     Pupils: Pupils are equal, round, and reactive to light.  Neck:     Musculoskeletal: Normal range of motion and neck supple.     Comments: No visible swelling to the anterior neck Cardiovascular:     Rate and Rhythm: Normal rate and regular rhythm.     Heart sounds: Normal heart sounds.  Pulmonary:     Effort:  Pulmonary effort is normal. No respiratory distress.     Breath sounds: Normal breath sounds. No wheezing or rales.  Chest:     Chest wall: No tenderness.  Abdominal:     General: Bowel sounds are normal.     Palpations: Abdomen is soft.     Tenderness: There is no abdominal tenderness. There is no guarding or rebound.  Musculoskeletal: Normal range of motion.  Lymphadenopathy:     Cervical: No cervical adenopathy.  Skin:    General: Skin is warm and dry.     Findings: No rash.  Neurological:     Mental Status: She is alert and oriented to person, place, and time.      ED Treatments / Results  Labs (all labs ordered are listed, but only abnormal results are displayed) Labs Reviewed  COMPREHENSIVE  METABOLIC PANEL - Abnormal; Notable for the following components:      Result Value   Potassium 3.0 (*)    Chloride 96 (*)    Glucose, Bld 147 (*)    Creatinine, Ser 1.08 (*)    Calcium 10.5 (*)    AST 13 (*)    GFR calc non Af Amer 51 (*)    GFR calc Af Amer 59 (*)    All other components within normal limits  CBC WITH DIFFERENTIAL/PLATELET - Abnormal; Notable for the following components:   Hemoglobin 11.4 (*)    MCV 77.2 (*)    MCH 23.4 (*)    All other components within normal limits  LIPASE, BLOOD  TROPONIN I (HIGH SENSITIVITY)    EKG EKG Interpretation  Date/Time:  Friday January 24 2019 11:36:15 EDT Ventricular Rate:  72 PR Interval:    QRS Duration: 87 QT Interval:  415 QTC Calculation: 455 R Axis:   -10 Text Interpretation:  Sinus rhythm Borderline T wave abnormalities No old tracing to compare Confirmed by Rolan BuccoBelfi, Dynastee Brummell [phone]) on 01/24/2019 11:52:42 AM Also confirmed by Rolan BuccoBelfi, Shironda Kain [phone]([id number]), editor Barbette Hairassel, [place] [phone]([id number])  on 01/24/2019 12:43:36 PM   Radiology Dg Chest 2 View  Result Date: 01/24/2019 CLINICAL DATA:  Difficulty swallowing. EXAM: CHEST - 2 VIEW COMPARISON:  CT abdomen 05/21/2016. FINDINGS: Mediastinum hilar structures normal. Borderline cardiomegaly. Low lung volumes. Mild basilar interstitial prominence noted. Mild interstitial edema and/or pneumonitis cannot be completely excluded. No pleural effusion or pneumothorax. Stable mild T7 compression fracture. IMPRESSION: 1.  Borderline cardiomegaly. 2. Low lung volumes. Mild basilar interstitial prominence noted. Mild interstitial edema and/or pneumonitis cannot be excluded. Electronically Signed   By: Maisie Fushomas  Register   On: 01/24/2019 15:04    Procedures Procedures (including critical care time)  Medications Ordered in ED Medications  alum & mag hydroxide-simeth (MAALOX/MYLANTA) 200-200-20 MG/5ML suspension 15 mL (15 mLs Oral Given 01/24/19 1257)  pantoprazole (PROTONIX) EC tablet 40 mg (40 mg Oral Given  01/24/19 1320)  potassium chloride SA (K-DUR) CR tablet 40 mEq (40 mEq Oral Given 01/24/19 1327)     Initial Impression / Assessment and Plan / ED Course  I have reviewed the triage vital signs and the nursing notes.  Pertinent labs & imaging results that were available during my care of the patient were reviewed by me and considered in my medical decision making (see chart for details).        Patient is a 73 year old female who presents with difficulty swallowing.  She is able to get food and liquids down but she has stated she would not really good and swallow small bites.  It  takes her a while until she feels like the food goes down.  She has no apparent airway involvement.  Chest x-ray shows some mild interstitial markings but she does not have any significant coughing, congestion or suggestions of infection.  She has mild hypokalemia and was given a dose of potassium in the ED.  I spoke with her PCP, [place] in [place] who states that he has already referred her to GI there in [place] for potential endoscopy.  She has some concerns for possible stricture or esophagitis.  I will make sure that she is taking her PPI regularly.  She has Nexium at home.  Dr. Nedra HaiLee will also follow-up on her hypokalemia and recheck this this week.  Return precautions were given.  Final Clinical Impressions(s) / ED Diagnoses   Final diagnoses:  Dysphagia, unspecified type  Hypokalemia    ED Discharge Orders    None       Rolan BuccoBelfi, Omolara Carol, MD 01/24/19 [phone]

## 2019-01-24 NOTE — Discharge Instructions (Signed)
Your primary care doctor has already put in a referral to a gastroenterologist in [place].  If you do not hear from them early next week, a give your doctor's office a call.  Also your potassium was low and needs to be rechecked.  Your doctor is wanting to see you next week.  Please call the office and make an appointment to be seen next week to have that rechecked.  Return here as needed if you have any worsening symptoms.

## 2019-01-28 DIAGNOSIS — M545 Low back pain: Secondary | ICD-10-CM | POA: Diagnosis not present

## 2019-01-28 DIAGNOSIS — R5382 Chronic fatigue, unspecified: Secondary | ICD-10-CM | POA: Diagnosis not present

## 2019-01-28 DIAGNOSIS — I10 Essential (primary) hypertension: Secondary | ICD-10-CM | POA: Diagnosis not present

## 2019-01-28 DIAGNOSIS — R1310 Dysphagia, unspecified: Secondary | ICD-10-CM | POA: Diagnosis not present

## 2019-01-28 DIAGNOSIS — E278 Other specified disorders of adrenal gland: Secondary | ICD-10-CM | POA: Diagnosis not present

## 2019-01-28 DIAGNOSIS — E7800 Pure hypercholesterolemia, unspecified: Secondary | ICD-10-CM | POA: Diagnosis not present

## 2019-01-28 DIAGNOSIS — E119 Type 2 diabetes mellitus without complications: Secondary | ICD-10-CM | POA: Diagnosis not present

## 2019-01-28 DIAGNOSIS — K219 Gastro-esophageal reflux disease without esophagitis: Secondary | ICD-10-CM | POA: Diagnosis not present

## 2019-01-28 DIAGNOSIS — N183 Chronic kidney disease, stage 3 (moderate): Secondary | ICD-10-CM | POA: Diagnosis not present

## 2019-02-04 DIAGNOSIS — E119 Type 2 diabetes mellitus without complications: Secondary | ICD-10-CM | POA: Diagnosis not present

## 2019-02-04 DIAGNOSIS — M545 Low back pain: Secondary | ICD-10-CM | POA: Diagnosis not present

## 2019-02-04 DIAGNOSIS — N183 Chronic kidney disease, stage 3 (moderate): Secondary | ICD-10-CM | POA: Diagnosis not present

## 2019-02-04 DIAGNOSIS — G8929 Other chronic pain: Secondary | ICD-10-CM | POA: Diagnosis not present

## 2019-02-04 DIAGNOSIS — I10 Essential (primary) hypertension: Secondary | ICD-10-CM | POA: Diagnosis not present

## 2019-02-04 DIAGNOSIS — R1310 Dysphagia, unspecified: Secondary | ICD-10-CM | POA: Diagnosis not present

## 2019-02-04 DIAGNOSIS — E278 Other specified disorders of adrenal gland: Secondary | ICD-10-CM | POA: Diagnosis not present

## 2019-02-04 DIAGNOSIS — K219 Gastro-esophageal reflux disease without esophagitis: Secondary | ICD-10-CM | POA: Diagnosis not present

## 2019-02-04 DIAGNOSIS — E7800 Pure hypercholesterolemia, unspecified: Secondary | ICD-10-CM | POA: Diagnosis not present

## 2019-02-18 DIAGNOSIS — R1310 Dysphagia, unspecified: Secondary | ICD-10-CM | POA: Diagnosis not present

## 2019-02-21 DIAGNOSIS — K219 Gastro-esophageal reflux disease without esophagitis: Secondary | ICD-10-CM | POA: Diagnosis not present

## 2019-02-21 DIAGNOSIS — I10 Essential (primary) hypertension: Secondary | ICD-10-CM | POA: Diagnosis not present

## 2019-02-21 DIAGNOSIS — N183 Chronic kidney disease, stage 3 (moderate): Secondary | ICD-10-CM | POA: Diagnosis not present

## 2019-02-21 DIAGNOSIS — M545 Low back pain: Secondary | ICD-10-CM | POA: Diagnosis not present

## 2019-02-21 DIAGNOSIS — E278 Other specified disorders of adrenal gland: Secondary | ICD-10-CM | POA: Diagnosis not present

## 2019-02-21 DIAGNOSIS — E119 Type 2 diabetes mellitus without complications: Secondary | ICD-10-CM | POA: Diagnosis not present

## 2019-02-21 DIAGNOSIS — J309 Allergic rhinitis, unspecified: Secondary | ICD-10-CM | POA: Diagnosis not present

## 2019-02-21 DIAGNOSIS — E7800 Pure hypercholesterolemia, unspecified: Secondary | ICD-10-CM | POA: Diagnosis not present

## 2019-02-21 DIAGNOSIS — R1310 Dysphagia, unspecified: Secondary | ICD-10-CM | POA: Diagnosis not present

## 2019-02-26 DIAGNOSIS — K222 Esophageal obstruction: Secondary | ICD-10-CM | POA: Diagnosis not present

## 2019-02-26 DIAGNOSIS — K219 Gastro-esophageal reflux disease without esophagitis: Secondary | ICD-10-CM | POA: Diagnosis not present

## 2019-02-26 DIAGNOSIS — K449 Diaphragmatic hernia without obstruction or gangrene: Secondary | ICD-10-CM | POA: Diagnosis not present

## 2019-02-26 DIAGNOSIS — R1310 Dysphagia, unspecified: Secondary | ICD-10-CM | POA: Diagnosis not present

## 2019-03-04 DIAGNOSIS — N959 Unspecified menopausal and perimenopausal disorder: Secondary | ICD-10-CM | POA: Diagnosis not present

## 2019-03-04 DIAGNOSIS — E119 Type 2 diabetes mellitus without complications: Secondary | ICD-10-CM | POA: Diagnosis not present

## 2019-03-04 DIAGNOSIS — Z1231 Encounter for screening mammogram for malignant neoplasm of breast: Secondary | ICD-10-CM | POA: Diagnosis not present

## 2019-03-04 DIAGNOSIS — Z1331 Encounter for screening for depression: Secondary | ICD-10-CM | POA: Diagnosis not present

## 2019-03-04 DIAGNOSIS — Z0000 Encounter for general adult medical examination without abnormal findings: Secondary | ICD-10-CM | POA: Diagnosis not present

## 2019-03-04 DIAGNOSIS — E785 Hyperlipidemia, unspecified: Secondary | ICD-10-CM | POA: Diagnosis not present

## 2019-03-04 DIAGNOSIS — Z1211 Encounter for screening for malignant neoplasm of colon: Secondary | ICD-10-CM | POA: Diagnosis not present

## 2019-03-04 DIAGNOSIS — Z9181 History of falling: Secondary | ICD-10-CM | POA: Diagnosis not present

## 2019-03-06 DIAGNOSIS — E7800 Pure hypercholesterolemia, unspecified: Secondary | ICD-10-CM | POA: Diagnosis not present

## 2019-03-06 DIAGNOSIS — R600 Localized edema: Secondary | ICD-10-CM | POA: Diagnosis not present

## 2019-03-06 DIAGNOSIS — E119 Type 2 diabetes mellitus without complications: Secondary | ICD-10-CM | POA: Diagnosis not present

## 2019-03-06 DIAGNOSIS — I10 Essential (primary) hypertension: Secondary | ICD-10-CM | POA: Diagnosis not present

## 2019-03-06 DIAGNOSIS — E278 Other specified disorders of adrenal gland: Secondary | ICD-10-CM | POA: Diagnosis not present

## 2019-03-06 DIAGNOSIS — K219 Gastro-esophageal reflux disease without esophagitis: Secondary | ICD-10-CM | POA: Diagnosis not present

## 2019-03-06 DIAGNOSIS — N183 Chronic kidney disease, stage 3 (moderate): Secondary | ICD-10-CM | POA: Diagnosis not present

## 2019-03-06 DIAGNOSIS — H6122 Impacted cerumen, left ear: Secondary | ICD-10-CM | POA: Diagnosis not present

## 2019-03-06 DIAGNOSIS — R1310 Dysphagia, unspecified: Secondary | ICD-10-CM | POA: Diagnosis not present

## 2019-04-01 DIAGNOSIS — K219 Gastro-esophageal reflux disease without esophagitis: Secondary | ICD-10-CM | POA: Diagnosis not present

## 2019-04-01 DIAGNOSIS — Z86010 Personal history of colonic polyps: Secondary | ICD-10-CM | POA: Diagnosis not present

## 2019-04-01 DIAGNOSIS — Z8719 Personal history of other diseases of the digestive system: Secondary | ICD-10-CM | POA: Diagnosis not present

## 2019-04-01 DIAGNOSIS — K5904 Chronic idiopathic constipation: Secondary | ICD-10-CM | POA: Diagnosis not present

## 2019-04-05 DIAGNOSIS — E278 Other specified disorders of adrenal gland: Secondary | ICD-10-CM | POA: Diagnosis not present

## 2019-04-05 DIAGNOSIS — J309 Allergic rhinitis, unspecified: Secondary | ICD-10-CM | POA: Diagnosis not present

## 2019-04-05 DIAGNOSIS — R600 Localized edema: Secondary | ICD-10-CM | POA: Diagnosis not present

## 2019-04-05 DIAGNOSIS — R1310 Dysphagia, unspecified: Secondary | ICD-10-CM | POA: Diagnosis not present

## 2019-04-05 DIAGNOSIS — E1140 Type 2 diabetes mellitus with diabetic neuropathy, unspecified: Secondary | ICD-10-CM | POA: Diagnosis not present

## 2019-04-05 DIAGNOSIS — N183 Chronic kidney disease, stage 3 (moderate): Secondary | ICD-10-CM | POA: Diagnosis not present

## 2019-04-05 DIAGNOSIS — M545 Low back pain: Secondary | ICD-10-CM | POA: Diagnosis not present

## 2019-04-05 DIAGNOSIS — K219 Gastro-esophageal reflux disease without esophagitis: Secondary | ICD-10-CM | POA: Diagnosis not present

## 2019-04-05 DIAGNOSIS — G8929 Other chronic pain: Secondary | ICD-10-CM | POA: Diagnosis not present

## 2019-04-18 DIAGNOSIS — E278 Other specified disorders of adrenal gland: Secondary | ICD-10-CM | POA: Diagnosis not present

## 2019-04-18 DIAGNOSIS — R600 Localized edema: Secondary | ICD-10-CM | POA: Diagnosis not present

## 2019-04-18 DIAGNOSIS — N183 Chronic kidney disease, stage 3 (moderate): Secondary | ICD-10-CM | POA: Diagnosis not present

## 2019-04-18 DIAGNOSIS — E119 Type 2 diabetes mellitus without complications: Secondary | ICD-10-CM | POA: Diagnosis not present

## 2019-04-18 DIAGNOSIS — K219 Gastro-esophageal reflux disease without esophagitis: Secondary | ICD-10-CM | POA: Diagnosis not present

## 2019-04-18 DIAGNOSIS — M545 Low back pain: Secondary | ICD-10-CM | POA: Diagnosis not present

## 2019-04-18 DIAGNOSIS — E1140 Type 2 diabetes mellitus with diabetic neuropathy, unspecified: Secondary | ICD-10-CM | POA: Diagnosis not present

## 2019-04-18 DIAGNOSIS — J309 Allergic rhinitis, unspecified: Secondary | ICD-10-CM | POA: Diagnosis not present

## 2019-04-18 DIAGNOSIS — R1310 Dysphagia, unspecified: Secondary | ICD-10-CM | POA: Diagnosis not present

## 2019-05-02 DIAGNOSIS — R1310 Dysphagia, unspecified: Secondary | ICD-10-CM | POA: Diagnosis not present

## 2019-05-02 DIAGNOSIS — G8929 Other chronic pain: Secondary | ICD-10-CM | POA: Diagnosis not present

## 2019-05-02 DIAGNOSIS — E119 Type 2 diabetes mellitus without complications: Secondary | ICD-10-CM | POA: Diagnosis not present

## 2019-05-02 DIAGNOSIS — Z23 Encounter for immunization: Secondary | ICD-10-CM | POA: Diagnosis not present

## 2019-05-02 DIAGNOSIS — E278 Other specified disorders of adrenal gland: Secondary | ICD-10-CM | POA: Diagnosis not present

## 2019-05-02 DIAGNOSIS — K219 Gastro-esophageal reflux disease without esophagitis: Secondary | ICD-10-CM | POA: Diagnosis not present

## 2019-05-02 DIAGNOSIS — M545 Low back pain: Secondary | ICD-10-CM | POA: Diagnosis not present

## 2019-05-02 DIAGNOSIS — J309 Allergic rhinitis, unspecified: Secondary | ICD-10-CM | POA: Diagnosis not present

## 2019-05-02 DIAGNOSIS — R600 Localized edema: Secondary | ICD-10-CM | POA: Diagnosis not present

## 2019-05-02 DIAGNOSIS — E1140 Type 2 diabetes mellitus with diabetic neuropathy, unspecified: Secondary | ICD-10-CM | POA: Diagnosis not present

## 2019-05-14 DIAGNOSIS — R600 Localized edema: Secondary | ICD-10-CM | POA: Diagnosis not present

## 2019-05-14 DIAGNOSIS — N183 Chronic kidney disease, stage 3 (moderate): Secondary | ICD-10-CM | POA: Diagnosis not present

## 2019-05-14 DIAGNOSIS — E1140 Type 2 diabetes mellitus with diabetic neuropathy, unspecified: Secondary | ICD-10-CM | POA: Diagnosis not present

## 2019-05-14 DIAGNOSIS — N390 Urinary tract infection, site not specified: Secondary | ICD-10-CM | POA: Diagnosis not present

## 2019-05-14 DIAGNOSIS — R1310 Dysphagia, unspecified: Secondary | ICD-10-CM | POA: Diagnosis not present

## 2019-05-14 DIAGNOSIS — K219 Gastro-esophageal reflux disease without esophagitis: Secondary | ICD-10-CM | POA: Diagnosis not present

## 2019-05-14 DIAGNOSIS — K5909 Other constipation: Secondary | ICD-10-CM | POA: Diagnosis not present

## 2019-05-14 DIAGNOSIS — E278 Other specified disorders of adrenal gland: Secondary | ICD-10-CM | POA: Diagnosis not present

## 2019-05-14 DIAGNOSIS — M545 Low back pain: Secondary | ICD-10-CM | POA: Diagnosis not present

## 2019-05-30 DIAGNOSIS — N183 Chronic kidney disease, stage 3 (moderate): Secondary | ICD-10-CM | POA: Diagnosis not present

## 2019-05-30 DIAGNOSIS — I10 Essential (primary) hypertension: Secondary | ICD-10-CM | POA: Diagnosis not present

## 2019-05-30 DIAGNOSIS — J309 Allergic rhinitis, unspecified: Secondary | ICD-10-CM | POA: Diagnosis not present

## 2019-05-30 DIAGNOSIS — K219 Gastro-esophageal reflux disease without esophagitis: Secondary | ICD-10-CM | POA: Diagnosis not present

## 2019-05-30 DIAGNOSIS — N390 Urinary tract infection, site not specified: Secondary | ICD-10-CM | POA: Diagnosis not present

## 2019-05-30 DIAGNOSIS — R600 Localized edema: Secondary | ICD-10-CM | POA: Diagnosis not present

## 2019-05-30 DIAGNOSIS — E119 Type 2 diabetes mellitus without complications: Secondary | ICD-10-CM | POA: Diagnosis not present

## 2019-05-30 DIAGNOSIS — R1310 Dysphagia, unspecified: Secondary | ICD-10-CM | POA: Diagnosis not present

## 2019-05-30 DIAGNOSIS — E1140 Type 2 diabetes mellitus with diabetic neuropathy, unspecified: Secondary | ICD-10-CM | POA: Diagnosis not present

## 2019-06-06 DIAGNOSIS — K219 Gastro-esophageal reflux disease without esophagitis: Secondary | ICD-10-CM | POA: Diagnosis not present

## 2019-06-06 DIAGNOSIS — E7800 Pure hypercholesterolemia, unspecified: Secondary | ICD-10-CM | POA: Diagnosis not present

## 2019-06-06 DIAGNOSIS — R1310 Dysphagia, unspecified: Secondary | ICD-10-CM | POA: Diagnosis not present

## 2019-06-06 DIAGNOSIS — M545 Low back pain: Secondary | ICD-10-CM | POA: Diagnosis not present

## 2019-06-06 DIAGNOSIS — J309 Allergic rhinitis, unspecified: Secondary | ICD-10-CM | POA: Diagnosis not present

## 2019-06-06 DIAGNOSIS — E278 Other specified disorders of adrenal gland: Secondary | ICD-10-CM | POA: Diagnosis not present

## 2019-06-06 DIAGNOSIS — R600 Localized edema: Secondary | ICD-10-CM | POA: Diagnosis not present

## 2019-06-06 DIAGNOSIS — E1140 Type 2 diabetes mellitus with diabetic neuropathy, unspecified: Secondary | ICD-10-CM | POA: Diagnosis not present

## 2019-06-06 DIAGNOSIS — I10 Essential (primary) hypertension: Secondary | ICD-10-CM | POA: Diagnosis not present

## 2019-06-27 DIAGNOSIS — K219 Gastro-esophageal reflux disease without esophagitis: Secondary | ICD-10-CM | POA: Diagnosis not present

## 2019-06-27 DIAGNOSIS — J309 Allergic rhinitis, unspecified: Secondary | ICD-10-CM | POA: Diagnosis not present

## 2019-06-27 DIAGNOSIS — R1310 Dysphagia, unspecified: Secondary | ICD-10-CM | POA: Diagnosis not present

## 2019-07-11 DIAGNOSIS — I10 Essential (primary) hypertension: Secondary | ICD-10-CM | POA: Diagnosis not present

## 2019-07-11 DIAGNOSIS — J309 Allergic rhinitis, unspecified: Secondary | ICD-10-CM | POA: Diagnosis not present

## 2019-07-11 DIAGNOSIS — R1310 Dysphagia, unspecified: Secondary | ICD-10-CM | POA: Diagnosis not present

## 2019-07-11 DIAGNOSIS — E119 Type 2 diabetes mellitus without complications: Secondary | ICD-10-CM | POA: Diagnosis not present

## 2019-07-11 DIAGNOSIS — E669 Obesity, unspecified: Secondary | ICD-10-CM | POA: Diagnosis not present

## 2019-07-11 DIAGNOSIS — R600 Localized edema: Secondary | ICD-10-CM | POA: Diagnosis not present

## 2019-07-11 DIAGNOSIS — E278 Other specified disorders of adrenal gland: Secondary | ICD-10-CM | POA: Diagnosis not present

## 2019-07-11 DIAGNOSIS — K219 Gastro-esophageal reflux disease without esophagitis: Secondary | ICD-10-CM | POA: Diagnosis not present

## 2019-07-11 DIAGNOSIS — E1140 Type 2 diabetes mellitus with diabetic neuropathy, unspecified: Secondary | ICD-10-CM | POA: Diagnosis not present

## 2019-07-11 DIAGNOSIS — J0100 Acute maxillary sinusitis, unspecified: Secondary | ICD-10-CM | POA: Diagnosis not present

## 2019-07-16 DIAGNOSIS — R1310 Dysphagia, unspecified: Secondary | ICD-10-CM | POA: Diagnosis not present

## 2019-07-16 DIAGNOSIS — R600 Localized edema: Secondary | ICD-10-CM | POA: Diagnosis not present

## 2019-07-16 DIAGNOSIS — K219 Gastro-esophageal reflux disease without esophagitis: Secondary | ICD-10-CM | POA: Diagnosis not present

## 2019-08-09 DIAGNOSIS — J309 Allergic rhinitis, unspecified: Secondary | ICD-10-CM | POA: Diagnosis not present

## 2019-08-09 DIAGNOSIS — E669 Obesity, unspecified: Secondary | ICD-10-CM | POA: Diagnosis not present

## 2019-08-09 DIAGNOSIS — E7800 Pure hypercholesterolemia, unspecified: Secondary | ICD-10-CM | POA: Diagnosis not present

## 2019-08-09 DIAGNOSIS — K219 Gastro-esophageal reflux disease without esophagitis: Secondary | ICD-10-CM | POA: Diagnosis not present

## 2019-08-09 DIAGNOSIS — I10 Essential (primary) hypertension: Secondary | ICD-10-CM | POA: Diagnosis not present

## 2019-08-09 DIAGNOSIS — R1310 Dysphagia, unspecified: Secondary | ICD-10-CM | POA: Diagnosis not present

## 2019-08-09 DIAGNOSIS — E119 Type 2 diabetes mellitus without complications: Secondary | ICD-10-CM | POA: Diagnosis not present

## 2019-08-20 DIAGNOSIS — J309 Allergic rhinitis, unspecified: Secondary | ICD-10-CM | POA: Diagnosis not present

## 2019-08-20 DIAGNOSIS — E669 Obesity, unspecified: Secondary | ICD-10-CM | POA: Diagnosis not present

## 2019-08-20 DIAGNOSIS — E1140 Type 2 diabetes mellitus with diabetic neuropathy, unspecified: Secondary | ICD-10-CM | POA: Diagnosis not present

## 2019-08-20 DIAGNOSIS — M545 Low back pain: Secondary | ICD-10-CM | POA: Diagnosis not present

## 2019-08-20 DIAGNOSIS — K219 Gastro-esophageal reflux disease without esophagitis: Secondary | ICD-10-CM | POA: Diagnosis not present

## 2019-08-20 DIAGNOSIS — Z6827 Body mass index (BMI) 27.0-27.9, adult: Secondary | ICD-10-CM | POA: Diagnosis not present

## 2019-08-20 DIAGNOSIS — E278 Other specified disorders of adrenal gland: Secondary | ICD-10-CM | POA: Diagnosis not present

## 2019-08-20 DIAGNOSIS — R600 Localized edema: Secondary | ICD-10-CM | POA: Diagnosis not present

## 2019-08-20 DIAGNOSIS — R1310 Dysphagia, unspecified: Secondary | ICD-10-CM | POA: Diagnosis not present

## 2019-08-20 DIAGNOSIS — J0100 Acute maxillary sinusitis, unspecified: Secondary | ICD-10-CM | POA: Diagnosis not present

## 2019-08-26 DIAGNOSIS — R1310 Dysphagia, unspecified: Secondary | ICD-10-CM | POA: Diagnosis not present

## 2019-08-26 DIAGNOSIS — E669 Obesity, unspecified: Secondary | ICD-10-CM | POA: Diagnosis not present

## 2019-08-26 DIAGNOSIS — E278 Other specified disorders of adrenal gland: Secondary | ICD-10-CM | POA: Diagnosis not present

## 2019-08-26 DIAGNOSIS — E1140 Type 2 diabetes mellitus with diabetic neuropathy, unspecified: Secondary | ICD-10-CM | POA: Diagnosis not present

## 2019-08-26 DIAGNOSIS — K5909 Other constipation: Secondary | ICD-10-CM | POA: Diagnosis not present

## 2019-08-26 DIAGNOSIS — M25511 Pain in right shoulder: Secondary | ICD-10-CM | POA: Diagnosis not present

## 2019-08-26 DIAGNOSIS — R600 Localized edema: Secondary | ICD-10-CM | POA: Diagnosis not present

## 2019-08-26 DIAGNOSIS — M545 Low back pain: Secondary | ICD-10-CM | POA: Diagnosis not present

## 2019-08-26 DIAGNOSIS — K219 Gastro-esophageal reflux disease without esophagitis: Secondary | ICD-10-CM | POA: Diagnosis not present

## 2019-08-26 DIAGNOSIS — Z6829 Body mass index (BMI) 29.0-29.9, adult: Secondary | ICD-10-CM | POA: Diagnosis not present

## 2019-09-09 DIAGNOSIS — R600 Localized edema: Secondary | ICD-10-CM | POA: Diagnosis not present

## 2019-09-09 DIAGNOSIS — Z6829 Body mass index (BMI) 29.0-29.9, adult: Secondary | ICD-10-CM | POA: Diagnosis not present

## 2019-09-09 DIAGNOSIS — E119 Type 2 diabetes mellitus without complications: Secondary | ICD-10-CM | POA: Diagnosis not present

## 2019-09-09 DIAGNOSIS — M545 Low back pain: Secondary | ICD-10-CM | POA: Diagnosis not present

## 2019-09-09 DIAGNOSIS — R1310 Dysphagia, unspecified: Secondary | ICD-10-CM | POA: Diagnosis not present

## 2019-09-09 DIAGNOSIS — K5909 Other constipation: Secondary | ICD-10-CM | POA: Diagnosis not present

## 2019-09-09 DIAGNOSIS — E278 Other specified disorders of adrenal gland: Secondary | ICD-10-CM | POA: Diagnosis not present

## 2019-09-09 DIAGNOSIS — E1140 Type 2 diabetes mellitus with diabetic neuropathy, unspecified: Secondary | ICD-10-CM | POA: Diagnosis not present

## 2019-09-09 DIAGNOSIS — M25511 Pain in right shoulder: Secondary | ICD-10-CM | POA: Diagnosis not present

## 2019-09-09 DIAGNOSIS — K219 Gastro-esophageal reflux disease without esophagitis: Secondary | ICD-10-CM | POA: Diagnosis not present

## 2019-09-17 DIAGNOSIS — E11630 Type 2 diabetes mellitus with periodontal disease: Secondary | ICD-10-CM | POA: Diagnosis not present

## 2019-09-17 DIAGNOSIS — G8929 Other chronic pain: Secondary | ICD-10-CM | POA: Diagnosis not present

## 2019-09-17 DIAGNOSIS — K5900 Constipation, unspecified: Secondary | ICD-10-CM | POA: Diagnosis not present

## 2019-09-17 DIAGNOSIS — K219 Gastro-esophageal reflux disease without esophagitis: Secondary | ICD-10-CM | POA: Diagnosis not present

## 2019-09-17 DIAGNOSIS — I10 Essential (primary) hypertension: Secondary | ICD-10-CM | POA: Diagnosis not present

## 2019-09-17 DIAGNOSIS — M1990 Unspecified osteoarthritis, unspecified site: Secondary | ICD-10-CM | POA: Diagnosis not present

## 2019-09-17 DIAGNOSIS — Z008 Encounter for other general examination: Secondary | ICD-10-CM | POA: Diagnosis not present

## 2019-09-17 DIAGNOSIS — R2689 Other abnormalities of gait and mobility: Secondary | ICD-10-CM | POA: Diagnosis not present

## 2019-09-17 DIAGNOSIS — E785 Hyperlipidemia, unspecified: Secondary | ICD-10-CM | POA: Diagnosis not present

## 2019-09-17 DIAGNOSIS — J309 Allergic rhinitis, unspecified: Secondary | ICD-10-CM | POA: Diagnosis not present

## 2019-09-17 DIAGNOSIS — E669 Obesity, unspecified: Secondary | ICD-10-CM | POA: Diagnosis not present

## 2019-09-23 DIAGNOSIS — E7800 Pure hypercholesterolemia, unspecified: Secondary | ICD-10-CM | POA: Diagnosis not present

## 2019-09-23 DIAGNOSIS — E278 Other specified disorders of adrenal gland: Secondary | ICD-10-CM | POA: Diagnosis not present

## 2019-09-23 DIAGNOSIS — R1310 Dysphagia, unspecified: Secondary | ICD-10-CM | POA: Diagnosis not present

## 2019-09-23 DIAGNOSIS — M545 Low back pain: Secondary | ICD-10-CM | POA: Diagnosis not present

## 2019-09-23 DIAGNOSIS — E1140 Type 2 diabetes mellitus with diabetic neuropathy, unspecified: Secondary | ICD-10-CM | POA: Diagnosis not present

## 2019-09-23 DIAGNOSIS — R600 Localized edema: Secondary | ICD-10-CM | POA: Diagnosis not present

## 2019-09-23 DIAGNOSIS — K219 Gastro-esophageal reflux disease without esophagitis: Secondary | ICD-10-CM | POA: Diagnosis not present

## 2019-09-23 DIAGNOSIS — K5909 Other constipation: Secondary | ICD-10-CM | POA: Diagnosis not present

## 2019-09-23 DIAGNOSIS — M25511 Pain in right shoulder: Secondary | ICD-10-CM | POA: Diagnosis not present

## 2019-09-23 DIAGNOSIS — E119 Type 2 diabetes mellitus without complications: Secondary | ICD-10-CM | POA: Diagnosis not present

## 2019-09-23 DIAGNOSIS — I10 Essential (primary) hypertension: Secondary | ICD-10-CM | POA: Diagnosis not present

## 2019-11-22 DIAGNOSIS — E278 Other specified disorders of adrenal gland: Secondary | ICD-10-CM | POA: Diagnosis not present

## 2019-11-22 DIAGNOSIS — K5909 Other constipation: Secondary | ICD-10-CM | POA: Diagnosis not present

## 2019-11-22 DIAGNOSIS — K219 Gastro-esophageal reflux disease without esophagitis: Secondary | ICD-10-CM | POA: Diagnosis not present

## 2019-11-22 DIAGNOSIS — M545 Low back pain: Secondary | ICD-10-CM | POA: Diagnosis not present

## 2019-11-22 DIAGNOSIS — Z139 Encounter for screening, unspecified: Secondary | ICD-10-CM | POA: Diagnosis not present

## 2019-11-22 DIAGNOSIS — N390 Urinary tract infection, site not specified: Secondary | ICD-10-CM | POA: Diagnosis not present

## 2019-11-22 DIAGNOSIS — R1310 Dysphagia, unspecified: Secondary | ICD-10-CM | POA: Diagnosis not present

## 2019-11-22 DIAGNOSIS — E7800 Pure hypercholesterolemia, unspecified: Secondary | ICD-10-CM | POA: Diagnosis not present

## 2019-11-22 DIAGNOSIS — R600 Localized edema: Secondary | ICD-10-CM | POA: Diagnosis not present

## 2019-11-22 DIAGNOSIS — E1140 Type 2 diabetes mellitus with diabetic neuropathy, unspecified: Secondary | ICD-10-CM | POA: Diagnosis not present

## 2019-11-22 DIAGNOSIS — E119 Type 2 diabetes mellitus without complications: Secondary | ICD-10-CM | POA: Diagnosis not present

## 2019-12-13 DIAGNOSIS — E278 Other specified disorders of adrenal gland: Secondary | ICD-10-CM | POA: Diagnosis not present

## 2019-12-13 DIAGNOSIS — J309 Allergic rhinitis, unspecified: Secondary | ICD-10-CM | POA: Diagnosis not present

## 2019-12-13 DIAGNOSIS — N390 Urinary tract infection, site not specified: Secondary | ICD-10-CM | POA: Diagnosis not present

## 2019-12-13 DIAGNOSIS — K219 Gastro-esophageal reflux disease without esophagitis: Secondary | ICD-10-CM | POA: Diagnosis not present

## 2019-12-13 DIAGNOSIS — R1310 Dysphagia, unspecified: Secondary | ICD-10-CM | POA: Diagnosis not present

## 2019-12-13 DIAGNOSIS — E1140 Type 2 diabetes mellitus with diabetic neuropathy, unspecified: Secondary | ICD-10-CM | POA: Diagnosis not present

## 2019-12-13 DIAGNOSIS — R600 Localized edema: Secondary | ICD-10-CM | POA: Diagnosis not present

## 2019-12-13 DIAGNOSIS — M545 Low back pain: Secondary | ICD-10-CM | POA: Diagnosis not present

## 2019-12-13 DIAGNOSIS — K5909 Other constipation: Secondary | ICD-10-CM | POA: Diagnosis not present

## 2019-12-13 DIAGNOSIS — G8929 Other chronic pain: Secondary | ICD-10-CM | POA: Diagnosis not present

## 2020-01-06 DIAGNOSIS — R600 Localized edema: Secondary | ICD-10-CM | POA: Diagnosis not present

## 2020-01-06 DIAGNOSIS — R1310 Dysphagia, unspecified: Secondary | ICD-10-CM | POA: Diagnosis not present

## 2020-01-06 DIAGNOSIS — M545 Low back pain: Secondary | ICD-10-CM | POA: Diagnosis not present

## 2020-01-06 DIAGNOSIS — E278 Other specified disorders of adrenal gland: Secondary | ICD-10-CM | POA: Diagnosis not present

## 2020-01-06 DIAGNOSIS — E1140 Type 2 diabetes mellitus with diabetic neuropathy, unspecified: Secondary | ICD-10-CM | POA: Diagnosis not present

## 2020-01-06 DIAGNOSIS — J309 Allergic rhinitis, unspecified: Secondary | ICD-10-CM | POA: Diagnosis not present

## 2020-01-06 DIAGNOSIS — K219 Gastro-esophageal reflux disease without esophagitis: Secondary | ICD-10-CM | POA: Diagnosis not present

## 2020-01-06 DIAGNOSIS — G8929 Other chronic pain: Secondary | ICD-10-CM | POA: Diagnosis not present

## 2020-01-06 DIAGNOSIS — R42 Dizziness and giddiness: Secondary | ICD-10-CM | POA: Diagnosis not present

## 2020-01-20 DIAGNOSIS — K219 Gastro-esophageal reflux disease without esophagitis: Secondary | ICD-10-CM | POA: Diagnosis not present

## 2020-01-20 DIAGNOSIS — R1310 Dysphagia, unspecified: Secondary | ICD-10-CM | POA: Diagnosis not present

## 2020-01-20 DIAGNOSIS — I10 Essential (primary) hypertension: Secondary | ICD-10-CM | POA: Diagnosis not present

## 2020-01-20 DIAGNOSIS — G8929 Other chronic pain: Secondary | ICD-10-CM | POA: Diagnosis not present

## 2020-01-20 DIAGNOSIS — M545 Low back pain: Secondary | ICD-10-CM | POA: Diagnosis not present

## 2020-01-20 DIAGNOSIS — K5909 Other constipation: Secondary | ICD-10-CM | POA: Diagnosis not present

## 2020-01-20 DIAGNOSIS — J309 Allergic rhinitis, unspecified: Secondary | ICD-10-CM | POA: Diagnosis not present

## 2020-01-20 DIAGNOSIS — R600 Localized edema: Secondary | ICD-10-CM | POA: Diagnosis not present

## 2020-01-20 DIAGNOSIS — E278 Other specified disorders of adrenal gland: Secondary | ICD-10-CM | POA: Diagnosis not present

## 2020-01-20 DIAGNOSIS — E1140 Type 2 diabetes mellitus with diabetic neuropathy, unspecified: Secondary | ICD-10-CM | POA: Diagnosis not present

## 2020-01-29 DIAGNOSIS — H524 Presbyopia: Secondary | ICD-10-CM | POA: Diagnosis not present

## 2020-01-29 DIAGNOSIS — Z9842 Cataract extraction status, left eye: Secondary | ICD-10-CM | POA: Diagnosis not present

## 2020-01-29 DIAGNOSIS — H26491 Other secondary cataract, right eye: Secondary | ICD-10-CM | POA: Diagnosis not present

## 2020-01-29 DIAGNOSIS — I10 Essential (primary) hypertension: Secondary | ICD-10-CM | POA: Diagnosis not present

## 2020-01-29 DIAGNOSIS — E119 Type 2 diabetes mellitus without complications: Secondary | ICD-10-CM | POA: Diagnosis not present

## 2020-01-29 DIAGNOSIS — Z961 Presence of intraocular lens: Secondary | ICD-10-CM | POA: Diagnosis not present

## 2020-01-29 DIAGNOSIS — H5203 Hypermetropia, bilateral: Secondary | ICD-10-CM | POA: Diagnosis not present

## 2020-01-29 DIAGNOSIS — H52223 Regular astigmatism, bilateral: Secondary | ICD-10-CM | POA: Diagnosis not present

## 2020-01-29 DIAGNOSIS — Z7984 Long term (current) use of oral hypoglycemic drugs: Secondary | ICD-10-CM | POA: Diagnosis not present

## 2020-02-07 DIAGNOSIS — N1831 Chronic kidney disease, stage 3a: Secondary | ICD-10-CM | POA: Diagnosis not present

## 2020-02-07 DIAGNOSIS — G8929 Other chronic pain: Secondary | ICD-10-CM | POA: Diagnosis not present

## 2020-02-07 DIAGNOSIS — K219 Gastro-esophageal reflux disease without esophagitis: Secondary | ICD-10-CM | POA: Diagnosis not present

## 2020-02-07 DIAGNOSIS — M545 Low back pain: Secondary | ICD-10-CM | POA: Diagnosis not present

## 2020-02-07 DIAGNOSIS — R600 Localized edema: Secondary | ICD-10-CM | POA: Diagnosis not present

## 2020-02-07 DIAGNOSIS — K5909 Other constipation: Secondary | ICD-10-CM | POA: Diagnosis not present

## 2020-02-07 DIAGNOSIS — I10 Essential (primary) hypertension: Secondary | ICD-10-CM | POA: Diagnosis not present

## 2020-02-07 DIAGNOSIS — E7800 Pure hypercholesterolemia, unspecified: Secondary | ICD-10-CM | POA: Diagnosis not present

## 2020-02-07 DIAGNOSIS — E1140 Type 2 diabetes mellitus with diabetic neuropathy, unspecified: Secondary | ICD-10-CM | POA: Diagnosis not present

## 2020-02-07 DIAGNOSIS — E278 Other specified disorders of adrenal gland: Secondary | ICD-10-CM | POA: Diagnosis not present

## 2020-02-07 DIAGNOSIS — R1310 Dysphagia, unspecified: Secondary | ICD-10-CM | POA: Diagnosis not present

## 2020-02-07 DIAGNOSIS — E118 Type 2 diabetes mellitus with unspecified complications: Secondary | ICD-10-CM | POA: Diagnosis not present

## 2020-03-06 DIAGNOSIS — G8929 Other chronic pain: Secondary | ICD-10-CM | POA: Diagnosis not present

## 2020-03-06 DIAGNOSIS — R600 Localized edema: Secondary | ICD-10-CM | POA: Diagnosis not present

## 2020-03-06 DIAGNOSIS — R1310 Dysphagia, unspecified: Secondary | ICD-10-CM | POA: Diagnosis not present

## 2020-03-06 DIAGNOSIS — I10 Essential (primary) hypertension: Secondary | ICD-10-CM | POA: Diagnosis not present

## 2020-03-06 DIAGNOSIS — K219 Gastro-esophageal reflux disease without esophagitis: Secondary | ICD-10-CM | POA: Diagnosis not present

## 2020-03-06 DIAGNOSIS — E278 Other specified disorders of adrenal gland: Secondary | ICD-10-CM | POA: Diagnosis not present

## 2020-03-06 DIAGNOSIS — N390 Urinary tract infection, site not specified: Secondary | ICD-10-CM | POA: Diagnosis not present

## 2020-03-06 DIAGNOSIS — E1140 Type 2 diabetes mellitus with diabetic neuropathy, unspecified: Secondary | ICD-10-CM | POA: Diagnosis not present

## 2020-03-06 DIAGNOSIS — M545 Low back pain: Secondary | ICD-10-CM | POA: Diagnosis not present

## 2020-03-06 DIAGNOSIS — N1831 Chronic kidney disease, stage 3a: Secondary | ICD-10-CM | POA: Diagnosis not present

## 2020-03-06 DIAGNOSIS — K5909 Other constipation: Secondary | ICD-10-CM | POA: Diagnosis not present

## 2020-03-22 DIAGNOSIS — E1140 Type 2 diabetes mellitus with diabetic neuropathy, unspecified: Secondary | ICD-10-CM | POA: Diagnosis not present

## 2020-03-22 DIAGNOSIS — K5909 Other constipation: Secondary | ICD-10-CM | POA: Diagnosis not present

## 2020-03-22 DIAGNOSIS — I10 Essential (primary) hypertension: Secondary | ICD-10-CM | POA: Diagnosis not present

## 2020-03-22 DIAGNOSIS — G8929 Other chronic pain: Secondary | ICD-10-CM | POA: Diagnosis not present

## 2020-03-22 DIAGNOSIS — E278 Other specified disorders of adrenal gland: Secondary | ICD-10-CM | POA: Diagnosis not present

## 2020-03-22 DIAGNOSIS — N390 Urinary tract infection, site not specified: Secondary | ICD-10-CM | POA: Diagnosis not present

## 2020-03-22 DIAGNOSIS — R1310 Dysphagia, unspecified: Secondary | ICD-10-CM | POA: Diagnosis not present

## 2020-03-22 DIAGNOSIS — N1831 Chronic kidney disease, stage 3a: Secondary | ICD-10-CM | POA: Diagnosis not present

## 2020-03-22 DIAGNOSIS — K219 Gastro-esophageal reflux disease without esophagitis: Secondary | ICD-10-CM | POA: Diagnosis not present

## 2020-03-22 DIAGNOSIS — R600 Localized edema: Secondary | ICD-10-CM | POA: Diagnosis not present

## 2020-04-05 DIAGNOSIS — R600 Localized edema: Secondary | ICD-10-CM | POA: Diagnosis not present

## 2020-04-05 DIAGNOSIS — G8929 Other chronic pain: Secondary | ICD-10-CM | POA: Diagnosis not present

## 2020-04-05 DIAGNOSIS — M545 Low back pain, unspecified: Secondary | ICD-10-CM | POA: Diagnosis not present

## 2020-04-05 DIAGNOSIS — Z1331 Encounter for screening for depression: Secondary | ICD-10-CM | POA: Diagnosis not present

## 2020-04-05 DIAGNOSIS — N390 Urinary tract infection, site not specified: Secondary | ICD-10-CM | POA: Diagnosis not present

## 2020-04-05 DIAGNOSIS — E1140 Type 2 diabetes mellitus with diabetic neuropathy, unspecified: Secondary | ICD-10-CM | POA: Diagnosis not present

## 2020-04-05 DIAGNOSIS — E278 Other specified disorders of adrenal gland: Secondary | ICD-10-CM | POA: Diagnosis not present

## 2020-04-05 DIAGNOSIS — K219 Gastro-esophageal reflux disease without esophagitis: Secondary | ICD-10-CM | POA: Diagnosis not present

## 2020-04-05 DIAGNOSIS — Z23 Encounter for immunization: Secondary | ICD-10-CM | POA: Diagnosis not present

## 2020-04-05 DIAGNOSIS — R1310 Dysphagia, unspecified: Secondary | ICD-10-CM | POA: Diagnosis not present

## 2020-04-28 DIAGNOSIS — E1140 Type 2 diabetes mellitus with diabetic neuropathy, unspecified: Secondary | ICD-10-CM | POA: Diagnosis not present

## 2020-04-28 DIAGNOSIS — Z9181 History of falling: Secondary | ICD-10-CM | POA: Diagnosis not present

## 2020-04-28 DIAGNOSIS — R600 Localized edema: Secondary | ICD-10-CM | POA: Diagnosis not present

## 2020-04-28 DIAGNOSIS — E278 Other specified disorders of adrenal gland: Secondary | ICD-10-CM | POA: Diagnosis not present

## 2020-04-28 DIAGNOSIS — N1831 Chronic kidney disease, stage 3a: Secondary | ICD-10-CM | POA: Diagnosis not present

## 2020-04-28 DIAGNOSIS — R1310 Dysphagia, unspecified: Secondary | ICD-10-CM | POA: Diagnosis not present

## 2020-04-28 DIAGNOSIS — G8929 Other chronic pain: Secondary | ICD-10-CM | POA: Diagnosis not present

## 2020-04-28 DIAGNOSIS — K219 Gastro-esophageal reflux disease without esophagitis: Secondary | ICD-10-CM | POA: Diagnosis not present

## 2020-04-28 DIAGNOSIS — M545 Low back pain, unspecified: Secondary | ICD-10-CM | POA: Diagnosis not present

## 2020-04-28 DIAGNOSIS — K5909 Other constipation: Secondary | ICD-10-CM | POA: Diagnosis not present

## 2020-06-02 DIAGNOSIS — G8929 Other chronic pain: Secondary | ICD-10-CM | POA: Diagnosis not present

## 2020-06-02 DIAGNOSIS — E278 Other specified disorders of adrenal gland: Secondary | ICD-10-CM | POA: Diagnosis not present

## 2020-06-02 DIAGNOSIS — K5909 Other constipation: Secondary | ICD-10-CM | POA: Diagnosis not present

## 2020-06-02 DIAGNOSIS — K219 Gastro-esophageal reflux disease without esophagitis: Secondary | ICD-10-CM | POA: Diagnosis not present

## 2020-06-02 DIAGNOSIS — E1140 Type 2 diabetes mellitus with diabetic neuropathy, unspecified: Secondary | ICD-10-CM | POA: Diagnosis not present

## 2020-06-02 DIAGNOSIS — R600 Localized edema: Secondary | ICD-10-CM | POA: Diagnosis not present

## 2020-06-02 DIAGNOSIS — E7800 Pure hypercholesterolemia, unspecified: Secondary | ICD-10-CM | POA: Diagnosis not present

## 2020-06-02 DIAGNOSIS — R1310 Dysphagia, unspecified: Secondary | ICD-10-CM | POA: Diagnosis not present

## 2020-06-02 DIAGNOSIS — I10 Essential (primary) hypertension: Secondary | ICD-10-CM | POA: Diagnosis not present

## 2020-06-02 DIAGNOSIS — N1831 Chronic kidney disease, stage 3a: Secondary | ICD-10-CM | POA: Diagnosis not present

## 2020-06-02 DIAGNOSIS — E1121 Type 2 diabetes mellitus with diabetic nephropathy: Secondary | ICD-10-CM | POA: Diagnosis not present

## 2020-06-02 DIAGNOSIS — M545 Low back pain, unspecified: Secondary | ICD-10-CM | POA: Diagnosis not present

## 2020-07-17 DIAGNOSIS — R600 Localized edema: Secondary | ICD-10-CM | POA: Diagnosis not present

## 2020-07-17 DIAGNOSIS — E278 Other specified disorders of adrenal gland: Secondary | ICD-10-CM | POA: Diagnosis not present

## 2020-07-17 DIAGNOSIS — E1121 Type 2 diabetes mellitus with diabetic nephropathy: Secondary | ICD-10-CM | POA: Diagnosis not present

## 2020-07-17 DIAGNOSIS — R1310 Dysphagia, unspecified: Secondary | ICD-10-CM | POA: Diagnosis not present

## 2020-07-17 DIAGNOSIS — K219 Gastro-esophageal reflux disease without esophagitis: Secondary | ICD-10-CM | POA: Diagnosis not present

## 2020-07-17 DIAGNOSIS — E1140 Type 2 diabetes mellitus with diabetic neuropathy, unspecified: Secondary | ICD-10-CM | POA: Diagnosis not present

## 2020-07-17 DIAGNOSIS — G8929 Other chronic pain: Secondary | ICD-10-CM | POA: Diagnosis not present

## 2020-07-17 DIAGNOSIS — K5909 Other constipation: Secondary | ICD-10-CM | POA: Diagnosis not present

## 2020-07-17 DIAGNOSIS — M545 Low back pain, unspecified: Secondary | ICD-10-CM | POA: Diagnosis not present

## 2020-07-17 DIAGNOSIS — J309 Allergic rhinitis, unspecified: Secondary | ICD-10-CM | POA: Diagnosis not present

## 2020-08-07 DIAGNOSIS — R600 Localized edema: Secondary | ICD-10-CM | POA: Diagnosis not present

## 2020-08-07 DIAGNOSIS — E1121 Type 2 diabetes mellitus with diabetic nephropathy: Secondary | ICD-10-CM | POA: Diagnosis not present

## 2020-08-07 DIAGNOSIS — R1310 Dysphagia, unspecified: Secondary | ICD-10-CM | POA: Diagnosis not present

## 2020-08-07 DIAGNOSIS — G8929 Other chronic pain: Secondary | ICD-10-CM | POA: Diagnosis not present

## 2020-08-07 DIAGNOSIS — J309 Allergic rhinitis, unspecified: Secondary | ICD-10-CM | POA: Diagnosis not present

## 2020-08-07 DIAGNOSIS — K219 Gastro-esophageal reflux disease without esophagitis: Secondary | ICD-10-CM | POA: Diagnosis not present

## 2020-08-07 DIAGNOSIS — K5909 Other constipation: Secondary | ICD-10-CM | POA: Diagnosis not present

## 2020-08-07 DIAGNOSIS — E278 Other specified disorders of adrenal gland: Secondary | ICD-10-CM | POA: Diagnosis not present

## 2020-08-07 DIAGNOSIS — E1140 Type 2 diabetes mellitus with diabetic neuropathy, unspecified: Secondary | ICD-10-CM | POA: Diagnosis not present

## 2020-08-07 DIAGNOSIS — M545 Low back pain, unspecified: Secondary | ICD-10-CM | POA: Diagnosis not present

## 2020-08-30 DIAGNOSIS — E278 Other specified disorders of adrenal gland: Secondary | ICD-10-CM | POA: Diagnosis not present

## 2020-08-30 DIAGNOSIS — R600 Localized edema: Secondary | ICD-10-CM | POA: Diagnosis not present

## 2020-08-30 DIAGNOSIS — I10 Essential (primary) hypertension: Secondary | ICD-10-CM | POA: Diagnosis not present

## 2020-08-30 DIAGNOSIS — J309 Allergic rhinitis, unspecified: Secondary | ICD-10-CM | POA: Diagnosis not present

## 2020-08-30 DIAGNOSIS — M545 Low back pain, unspecified: Secondary | ICD-10-CM | POA: Diagnosis not present

## 2020-08-30 DIAGNOSIS — E1140 Type 2 diabetes mellitus with diabetic neuropathy, unspecified: Secondary | ICD-10-CM | POA: Diagnosis not present

## 2020-08-30 DIAGNOSIS — E1121 Type 2 diabetes mellitus with diabetic nephropathy: Secondary | ICD-10-CM | POA: Diagnosis not present

## 2020-08-30 DIAGNOSIS — R1310 Dysphagia, unspecified: Secondary | ICD-10-CM | POA: Diagnosis not present

## 2020-08-30 DIAGNOSIS — K219 Gastro-esophageal reflux disease without esophagitis: Secondary | ICD-10-CM | POA: Diagnosis not present

## 2020-08-30 DIAGNOSIS — G8929 Other chronic pain: Secondary | ICD-10-CM | POA: Diagnosis not present

## 2020-08-30 DIAGNOSIS — E7800 Pure hypercholesterolemia, unspecified: Secondary | ICD-10-CM | POA: Diagnosis not present

## 2020-09-16 IMAGING — DX CHEST - 2 VIEW
2 series · 2 of 2 positions shown · non-contrast
Comparison: CT abdomen 05/21/2016.

CLINICAL DATA: Difficulty swallowing.

EXAM:
CHEST - 2 VIEW

[chest pa]
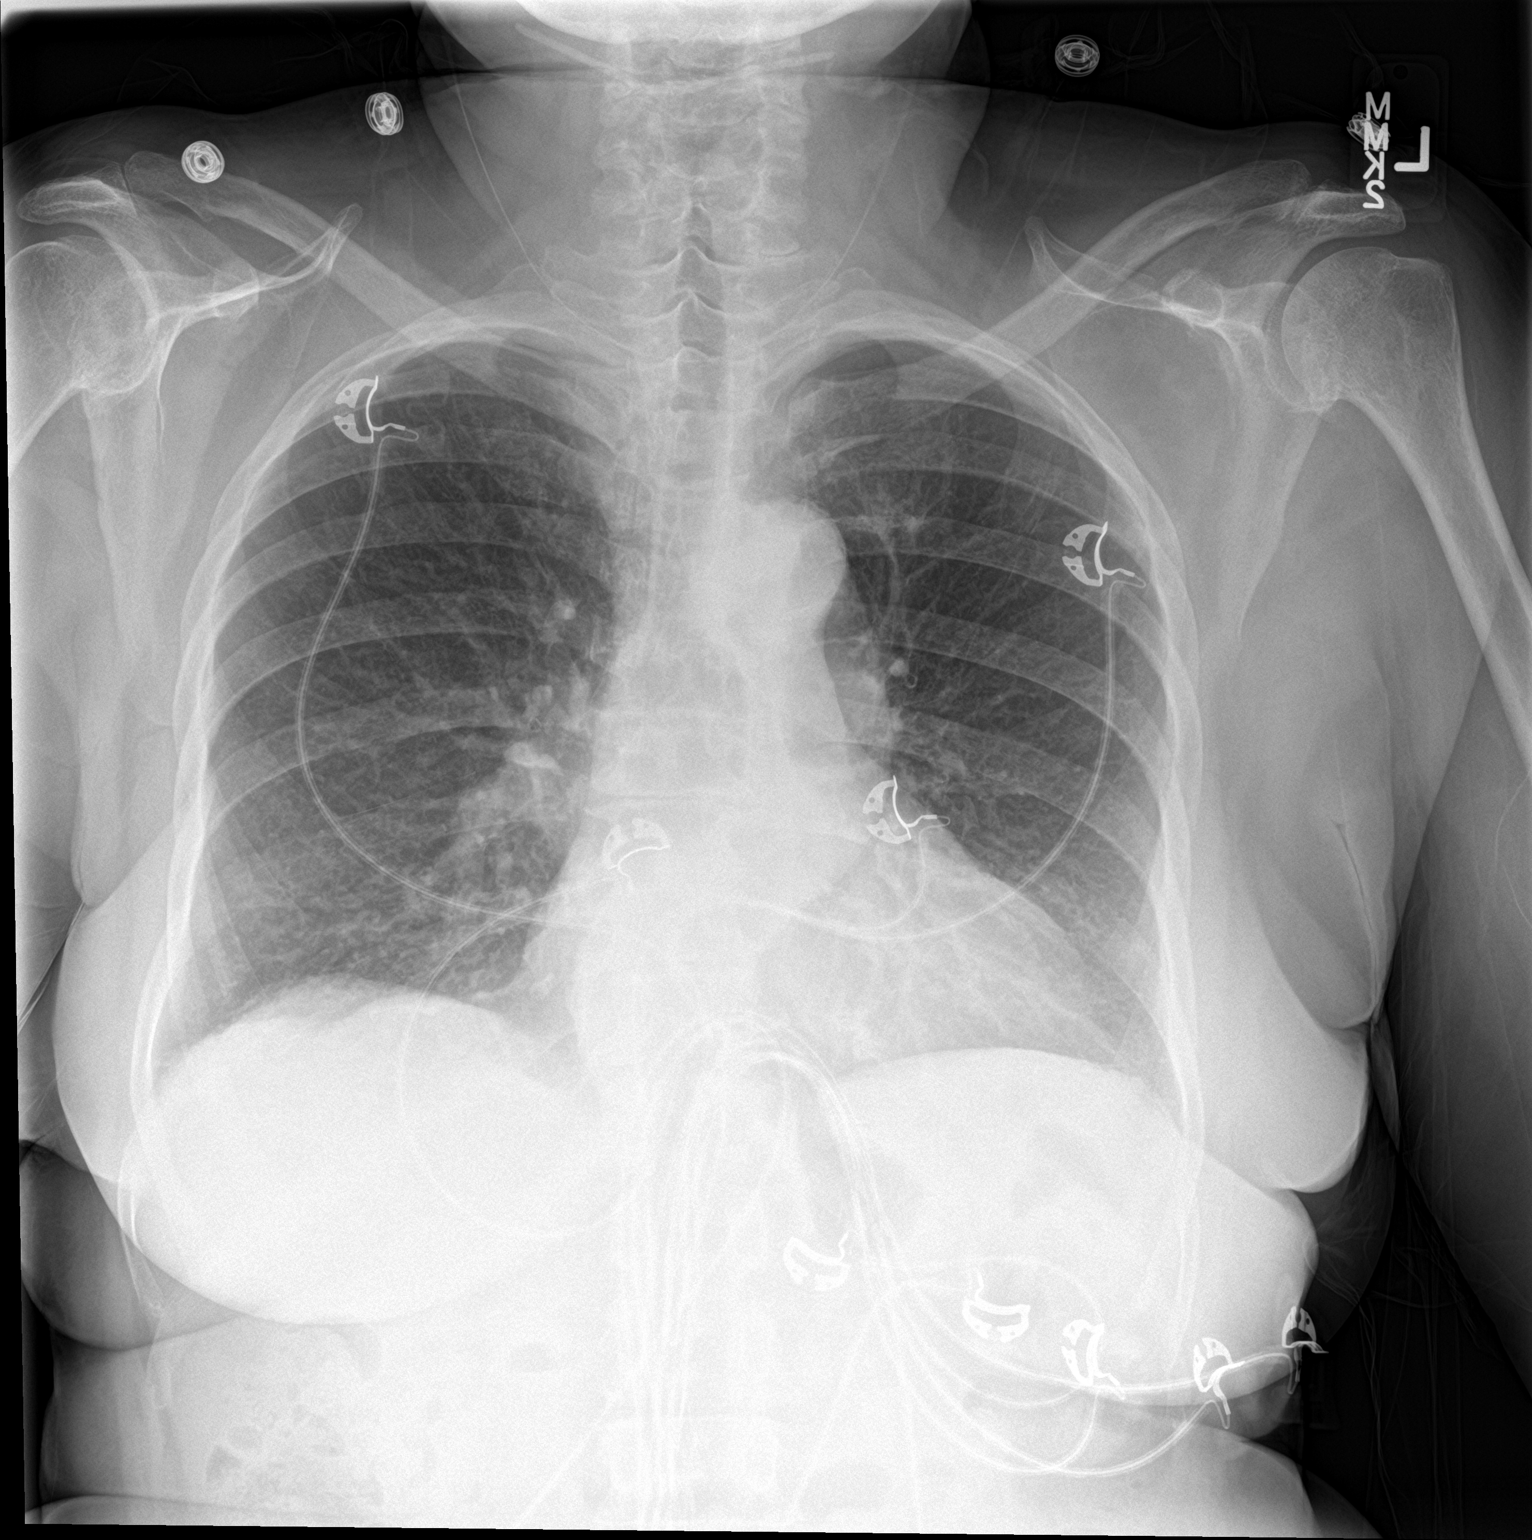

[chest lat]
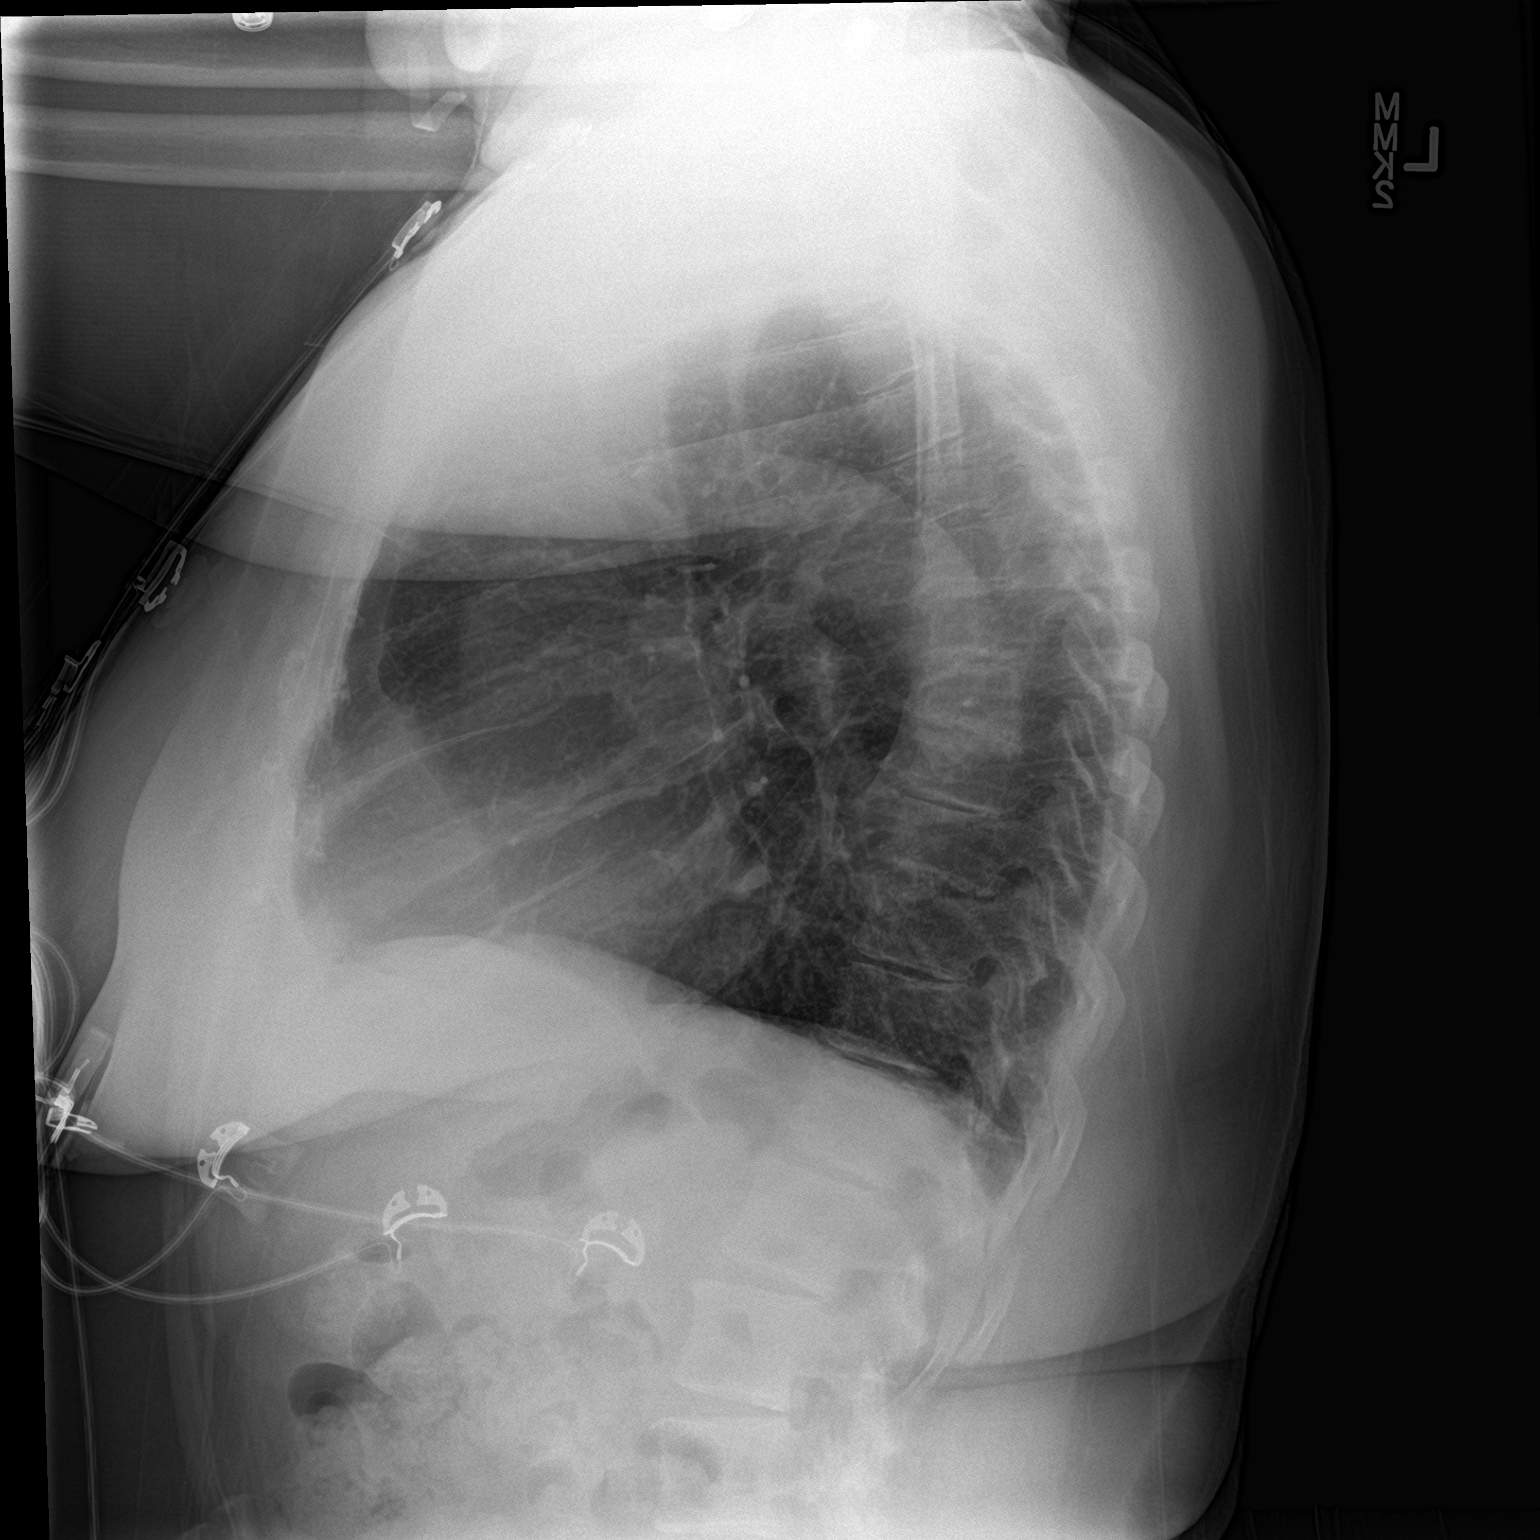

[2 of 2 positions shown; findings below may reference images not displayed]

FINDINGS: Mediastinum hilar structures normal. Borderline cardiomegaly. Low
lung volumes. Mild basilar interstitial prominence noted. Mild
interstitial edema and/or pneumonitis cannot be completely excluded.
No pleural effusion or pneumothorax. Stable mild T7 compression
fracture.
IMPRESSION: 1.  Borderline cardiomegaly.

2. Low lung volumes. Mild basilar interstitial prominence noted.
Mild interstitial edema and/or pneumonitis cannot be excluded.

## 2020-09-18 DIAGNOSIS — E1121 Type 2 diabetes mellitus with diabetic nephropathy: Secondary | ICD-10-CM | POA: Diagnosis not present

## 2020-09-18 DIAGNOSIS — M545 Low back pain, unspecified: Secondary | ICD-10-CM | POA: Diagnosis not present

## 2020-09-18 DIAGNOSIS — K219 Gastro-esophageal reflux disease without esophagitis: Secondary | ICD-10-CM | POA: Diagnosis not present

## 2020-09-18 DIAGNOSIS — E278 Other specified disorders of adrenal gland: Secondary | ICD-10-CM | POA: Diagnosis not present

## 2020-09-18 DIAGNOSIS — E1140 Type 2 diabetes mellitus with diabetic neuropathy, unspecified: Secondary | ICD-10-CM | POA: Diagnosis not present

## 2020-09-18 DIAGNOSIS — J309 Allergic rhinitis, unspecified: Secondary | ICD-10-CM | POA: Diagnosis not present

## 2020-09-18 DIAGNOSIS — K5909 Other constipation: Secondary | ICD-10-CM | POA: Diagnosis not present

## 2020-09-18 DIAGNOSIS — R1310 Dysphagia, unspecified: Secondary | ICD-10-CM | POA: Diagnosis not present

## 2020-09-18 DIAGNOSIS — R600 Localized edema: Secondary | ICD-10-CM | POA: Diagnosis not present

## 2020-09-18 DIAGNOSIS — G8929 Other chronic pain: Secondary | ICD-10-CM | POA: Diagnosis not present

## 2020-10-28 DIAGNOSIS — M1990 Unspecified osteoarthritis, unspecified site: Secondary | ICD-10-CM | POA: Diagnosis not present

## 2020-10-28 DIAGNOSIS — E119 Type 2 diabetes mellitus without complications: Secondary | ICD-10-CM | POA: Diagnosis not present

## 2020-10-28 DIAGNOSIS — E785 Hyperlipidemia, unspecified: Secondary | ICD-10-CM | POA: Diagnosis not present

## 2020-10-28 DIAGNOSIS — Z791 Long term (current) use of non-steroidal anti-inflammatories (NSAID): Secondary | ICD-10-CM | POA: Diagnosis not present

## 2020-10-28 DIAGNOSIS — J309 Allergic rhinitis, unspecified: Secondary | ICD-10-CM | POA: Diagnosis not present

## 2020-10-28 DIAGNOSIS — R609 Edema, unspecified: Secondary | ICD-10-CM | POA: Diagnosis not present

## 2020-10-28 DIAGNOSIS — R32 Unspecified urinary incontinence: Secondary | ICD-10-CM | POA: Diagnosis not present

## 2020-10-28 DIAGNOSIS — I10 Essential (primary) hypertension: Secondary | ICD-10-CM | POA: Diagnosis not present

## 2020-10-28 DIAGNOSIS — E669 Obesity, unspecified: Secondary | ICD-10-CM | POA: Diagnosis not present

## 2020-10-28 DIAGNOSIS — Z6832 Body mass index (BMI) 32.0-32.9, adult: Secondary | ICD-10-CM | POA: Diagnosis not present

## 2020-10-30 DIAGNOSIS — G8929 Other chronic pain: Secondary | ICD-10-CM | POA: Diagnosis not present

## 2020-10-30 DIAGNOSIS — R1310 Dysphagia, unspecified: Secondary | ICD-10-CM | POA: Diagnosis not present

## 2020-10-30 DIAGNOSIS — R600 Localized edema: Secondary | ICD-10-CM | POA: Diagnosis not present

## 2020-10-30 DIAGNOSIS — E1140 Type 2 diabetes mellitus with diabetic neuropathy, unspecified: Secondary | ICD-10-CM | POA: Diagnosis not present

## 2020-10-30 DIAGNOSIS — K5909 Other constipation: Secondary | ICD-10-CM | POA: Diagnosis not present

## 2020-10-30 DIAGNOSIS — E278 Other specified disorders of adrenal gland: Secondary | ICD-10-CM | POA: Diagnosis not present

## 2020-10-30 DIAGNOSIS — K219 Gastro-esophageal reflux disease without esophagitis: Secondary | ICD-10-CM | POA: Diagnosis not present

## 2020-10-30 DIAGNOSIS — I10 Essential (primary) hypertension: Secondary | ICD-10-CM | POA: Diagnosis not present

## 2020-10-30 DIAGNOSIS — M545 Low back pain, unspecified: Secondary | ICD-10-CM | POA: Diagnosis not present

## 2020-10-30 DIAGNOSIS — J309 Allergic rhinitis, unspecified: Secondary | ICD-10-CM | POA: Diagnosis not present

## 2020-10-30 DIAGNOSIS — E1121 Type 2 diabetes mellitus with diabetic nephropathy: Secondary | ICD-10-CM | POA: Diagnosis not present

## 2020-12-11 DIAGNOSIS — M545 Low back pain, unspecified: Secondary | ICD-10-CM | POA: Diagnosis not present

## 2020-12-11 DIAGNOSIS — R600 Localized edema: Secondary | ICD-10-CM | POA: Diagnosis not present

## 2020-12-11 DIAGNOSIS — I10 Essential (primary) hypertension: Secondary | ICD-10-CM | POA: Diagnosis not present

## 2020-12-11 DIAGNOSIS — E278 Other specified disorders of adrenal gland: Secondary | ICD-10-CM | POA: Diagnosis not present

## 2020-12-11 DIAGNOSIS — G8929 Other chronic pain: Secondary | ICD-10-CM | POA: Diagnosis not present

## 2020-12-11 DIAGNOSIS — E1140 Type 2 diabetes mellitus with diabetic neuropathy, unspecified: Secondary | ICD-10-CM | POA: Diagnosis not present

## 2020-12-11 DIAGNOSIS — R1310 Dysphagia, unspecified: Secondary | ICD-10-CM | POA: Diagnosis not present

## 2020-12-11 DIAGNOSIS — K219 Gastro-esophageal reflux disease without esophagitis: Secondary | ICD-10-CM | POA: Diagnosis not present

## 2020-12-11 DIAGNOSIS — J309 Allergic rhinitis, unspecified: Secondary | ICD-10-CM | POA: Diagnosis not present

## 2020-12-11 DIAGNOSIS — E7800 Pure hypercholesterolemia, unspecified: Secondary | ICD-10-CM | POA: Diagnosis not present

## 2020-12-11 DIAGNOSIS — E1121 Type 2 diabetes mellitus with diabetic nephropathy: Secondary | ICD-10-CM | POA: Diagnosis not present

## 2021-01-14 DIAGNOSIS — E663 Overweight: Secondary | ICD-10-CM | POA: Diagnosis not present

## 2021-01-14 DIAGNOSIS — Z6829 Body mass index (BMI) 29.0-29.9, adult: Secondary | ICD-10-CM | POA: Diagnosis not present

## 2021-01-14 DIAGNOSIS — E1121 Type 2 diabetes mellitus with diabetic nephropathy: Secondary | ICD-10-CM | POA: Diagnosis not present

## 2021-01-14 DIAGNOSIS — N390 Urinary tract infection, site not specified: Secondary | ICD-10-CM | POA: Diagnosis not present

## 2021-01-14 DIAGNOSIS — I10 Essential (primary) hypertension: Secondary | ICD-10-CM | POA: Diagnosis not present

## 2021-01-21 DIAGNOSIS — I10 Essential (primary) hypertension: Secondary | ICD-10-CM | POA: Diagnosis not present

## 2021-01-21 DIAGNOSIS — E1121 Type 2 diabetes mellitus with diabetic nephropathy: Secondary | ICD-10-CM | POA: Diagnosis not present

## 2021-01-21 DIAGNOSIS — G8929 Other chronic pain: Secondary | ICD-10-CM | POA: Diagnosis not present

## 2021-01-21 DIAGNOSIS — R1310 Dysphagia, unspecified: Secondary | ICD-10-CM | POA: Diagnosis not present

## 2021-01-21 DIAGNOSIS — M545 Low back pain, unspecified: Secondary | ICD-10-CM | POA: Diagnosis not present

## 2021-01-21 DIAGNOSIS — Z139 Encounter for screening, unspecified: Secondary | ICD-10-CM | POA: Diagnosis not present

## 2021-01-21 DIAGNOSIS — J309 Allergic rhinitis, unspecified: Secondary | ICD-10-CM | POA: Diagnosis not present

## 2021-01-21 DIAGNOSIS — E278 Other specified disorders of adrenal gland: Secondary | ICD-10-CM | POA: Diagnosis not present

## 2021-01-21 DIAGNOSIS — N390 Urinary tract infection, site not specified: Secondary | ICD-10-CM | POA: Diagnosis not present

## 2021-01-21 DIAGNOSIS — R600 Localized edema: Secondary | ICD-10-CM | POA: Diagnosis not present

## 2021-01-25 DIAGNOSIS — H47012 Ischemic optic neuropathy, left eye: Secondary | ICD-10-CM | POA: Diagnosis not present

## 2021-04-08 DIAGNOSIS — I10 Essential (primary) hypertension: Secondary | ICD-10-CM | POA: Diagnosis not present

## 2021-04-08 DIAGNOSIS — Z23 Encounter for immunization: Secondary | ICD-10-CM | POA: Diagnosis not present

## 2021-04-08 DIAGNOSIS — E7800 Pure hypercholesterolemia, unspecified: Secondary | ICD-10-CM | POA: Diagnosis not present

## 2021-04-08 DIAGNOSIS — J309 Allergic rhinitis, unspecified: Secondary | ICD-10-CM | POA: Diagnosis not present

## 2021-04-08 DIAGNOSIS — R1310 Dysphagia, unspecified: Secondary | ICD-10-CM | POA: Diagnosis not present

## 2021-04-08 DIAGNOSIS — M545 Low back pain, unspecified: Secondary | ICD-10-CM | POA: Diagnosis not present

## 2021-04-08 DIAGNOSIS — E1121 Type 2 diabetes mellitus with diabetic nephropathy: Secondary | ICD-10-CM | POA: Diagnosis not present

## 2021-04-08 DIAGNOSIS — R600 Localized edema: Secondary | ICD-10-CM | POA: Diagnosis not present

## 2021-04-08 DIAGNOSIS — Z1331 Encounter for screening for depression: Secondary | ICD-10-CM | POA: Diagnosis not present

## 2021-04-08 DIAGNOSIS — G8929 Other chronic pain: Secondary | ICD-10-CM | POA: Diagnosis not present

## 2021-04-27 DIAGNOSIS — Z0000 Encounter for general adult medical examination without abnormal findings: Secondary | ICD-10-CM | POA: Diagnosis not present

## 2021-04-27 DIAGNOSIS — Z1331 Encounter for screening for depression: Secondary | ICD-10-CM | POA: Diagnosis not present

## 2021-04-27 DIAGNOSIS — Z6830 Body mass index (BMI) 30.0-30.9, adult: Secondary | ICD-10-CM | POA: Diagnosis not present

## 2021-04-27 DIAGNOSIS — E669 Obesity, unspecified: Secondary | ICD-10-CM | POA: Diagnosis not present

## 2021-04-27 DIAGNOSIS — Z9181 History of falling: Secondary | ICD-10-CM | POA: Diagnosis not present

## 2021-04-27 DIAGNOSIS — E785 Hyperlipidemia, unspecified: Secondary | ICD-10-CM | POA: Diagnosis not present
